# Patient Record
Sex: Male | Born: 1980 | Hispanic: Yes | Marital: Single | Smoking: Former smoker
Health system: Southern US, Community
[De-identification: ages and names within clinical notes are randomized; demographics above are authoritative.]

## PROBLEM LIST (undated history)

## (undated) DIAGNOSIS — J45909 Unspecified asthma, uncomplicated: Secondary | ICD-10-CM

## (undated) DIAGNOSIS — I10 Essential (primary) hypertension: Secondary | ICD-10-CM

## (undated) HISTORY — DX: Essential (primary) hypertension: I10

## (undated) HISTORY — PX: NO PAST SURGERIES: SHX2092

## (undated) HISTORY — DX: Unspecified asthma, uncomplicated: J45.909

---

## 2014-08-29 ENCOUNTER — Encounter (HOSPITAL_COMMUNITY): Payer: Self-pay | Admitting: Emergency Medicine

## 2014-08-29 ENCOUNTER — Emergency Department (HOSPITAL_COMMUNITY)
Admission: EM | Admit: 2014-08-29 | Discharge: 2014-08-29 | Disposition: A | Discharge status: Home / Self Care | Payer: Self-pay | Attending: Emergency Medicine | Admitting: Emergency Medicine

## 2014-08-29 ENCOUNTER — Emergency Department (HOSPITAL_COMMUNITY): Payer: Self-pay

## 2014-08-29 DIAGNOSIS — R0989 Other specified symptoms and signs involving the circulatory and respiratory systems: Principal | ICD-10-CM | POA: Insufficient documentation

## 2014-08-29 DIAGNOSIS — F411 Generalized anxiety disorder: Secondary | ICD-10-CM | POA: Insufficient documentation

## 2014-08-29 DIAGNOSIS — R0600 Dyspnea, unspecified: Secondary | ICD-10-CM

## 2014-08-29 DIAGNOSIS — R1013 Epigastric pain: Secondary | ICD-10-CM | POA: Insufficient documentation

## 2014-08-29 DIAGNOSIS — R0609 Other forms of dyspnea: Secondary | ICD-10-CM | POA: Insufficient documentation

## 2014-08-29 DIAGNOSIS — F419 Anxiety disorder, unspecified: Secondary | ICD-10-CM

## 2014-08-29 DIAGNOSIS — F17200 Nicotine dependence, unspecified, uncomplicated: Secondary | ICD-10-CM | POA: Insufficient documentation

## 2014-08-29 DIAGNOSIS — R0602 Shortness of breath: Secondary | ICD-10-CM | POA: Insufficient documentation

## 2014-08-29 DIAGNOSIS — R0789 Other chest pain: Secondary | ICD-10-CM | POA: Insufficient documentation

## 2014-08-29 LAB — COMPREHENSIVE METABOLIC PANEL
ALBUMIN: 4.5 g/dL (ref 3.5–5.2)
ALK PHOS: 71 U/L (ref 39–117)
ALT: 23 U/L (ref 0–53)
AST: 26 U/L (ref 0–37)
Anion gap: 19 — ABNORMAL HIGH (ref 5–15)
BUN: 8 mg/dL (ref 6–23)
CALCIUM: 9.9 mg/dL (ref 8.4–10.5)
CO2: 20 mEq/L (ref 19–32)
Chloride: 102 mEq/L (ref 96–112)
Creatinine, Ser: 0.58 mg/dL (ref 0.50–1.35)
GFR calc Af Amer: >90 mL/min (ref >90)
GFR calc non Af Amer: >90 mL/min (ref >90)
GLUCOSE: 106 mg/dL — AB (ref 70–99)
POTASSIUM: 4.1 meq/L (ref 3.7–5.3)
SODIUM: 141 meq/L (ref 137–147)
TOTAL PROTEIN: 8.3 g/dL (ref 6.0–8.3)
Total Bilirubin: 0.5 mg/dL (ref 0.3–1.2)

## 2014-08-29 LAB — CBC
HCT: 44.3 % (ref 39.0–52.0)
HEMOGLOBIN: 15.6 g/dL (ref 13.0–17.0)
MCH: 31.7 pg (ref 26.0–34.0)
MCHC: 35.2 g/dL (ref 30.0–36.0)
MCV: 90 fL (ref 78.0–100.0)
PLATELETS: 207 10*3/uL (ref 150–400)
RBC: 4.92 MIL/uL (ref 4.22–5.81)
RDW: 12 % (ref 11.5–15.5)
WBC: 9.9 10*3/uL (ref 4.0–10.5)

## 2014-08-29 LAB — LIPASE, BLOOD: Lipase: 22 U/L (ref 11–59)

## 2014-08-29 LAB — TROPONIN I

## 2014-08-29 NOTE — Discharge Instructions (Signed)
Follow up with primary care doctor in coming week. Return to ER if worse, new symptoms, fevers, trouble breathing, persistent/recurrent chest pain, other concern.

## 2014-08-29 NOTE — ED Notes (Signed)
Dr. Denton Lank has just spoken at length with pt. With our translator phone with [employer].  Pt. Remains in no distress.

## 2014-08-29 NOTE — ED Notes (Addendum)
Per EMS pt c/o shortness of breath x 1 week, per phone interpreter pt sts he "looses his breath for 3 seconds at a time" he stated that happened few weeks ago, however now he c/o pain "between lungs and abdomen"

## 2014-08-29 NOTE — ED Notes (Signed)
He tells me that he has occasionally had "my breathing 'stops' once-in-a-while for about a month now".  When asked if he is asthmatic, he mentions being told by a doctor in the distant past that he may have asthma.  Now his lung sounds are clear bilat., and he is breathing normally.

## 2014-08-29 NOTE — ED Provider Notes (Signed)
CSN: [id number]     Arrival date & time 08/29/14  0947 History   First MD Initiated Contact with Patient 08/29/14 1013     Chief Complaint  Patient presents with  . Abdominal Pain  . Shortness of Breath     (Consider location/radiation/quality/duration/timing/severity/associated sxs/prior Treatment) Patient is a 33 y.o. male presenting with abdominal pain and shortness of breath. The history is provided by the patient. A language interpreter was used.  Abdominal Pain Associated symptoms: shortness of breath   Associated symptoms: no chills, no cough, no diarrhea, no fever, no sore throat and no vomiting   Shortness of Breath Associated symptoms: abdominal pain   Associated symptoms: no cough, no fever, no headaches, no neck pain, no rash, no sore throat and no vomiting   pt c/o pain lower sternal and epigastric area in the past day. Pain constant, dull. Non radiating. States makes him feel short of breath for 2-3 seconds at a time. Occurs at rest. No relation to activity or exertion. Denies cough or uri c/o. No hx cad or fam hx cad. No hx gerd, pud, gallstones or pancreatitis. No specific exacerbating or alleviating factors. No nvd.  No abd distension or prior abd surgery.       History reviewed. No pertinent past medical history. History reviewed. No pertinent past surgical history. No family history on file. History  Substance Use Topics  . Smoking status: Current Some Day Smoker  . Smokeless tobacco: Not on file  . Alcohol Use: Yes     Comment: daily    Review of Systems  Constitutional: Negative for fever and chills.  HENT: Negative for sore throat.   Eyes: Negative for redness.  Respiratory: Positive for shortness of breath. Negative for cough.   Cardiovascular: Negative for leg swelling.  Gastrointestinal: Positive for abdominal pain. Negative for vomiting and diarrhea.  Genitourinary: Negative for flank pain.  Musculoskeletal: Negative for back pain and neck pain.   Skin: Negative for rash.  Neurological: Negative for headaches.  Hematological: Does not bruise/bleed easily.  Psychiatric/Behavioral: Negative for confusion.      Allergies  Pork-derived products  Home Medications   Prior to Admission medications   Medication Sig Start Date End Date Taking? Authorizing Provider  acetaminophen (TYLENOL) 500 MG tablet Take 1,000 mg by mouth every 6 (six) hours as needed for mild pain or moderate pain.   Yes Historical Provider, MD   BP 165/98  Pulse 80  Temp(Src) 98.5 F (36.9 C)  SpO2 98% Physical Exam  Nursing note and vitals reviewed. Constitutional: He appears well-developed and well-nourished. No distress.  HENT:  Head: Atraumatic.  Mouth/Throat: Oropharynx is clear and moist.  Eyes: Conjunctivae are normal. No scleral icterus.  Neck: Neck supple. No tracheal deviation present.  Cardiovascular: Normal rate, regular rhythm, normal heart sounds and intact distal pulses.  Exam reveals no gallop and no friction rub.   No murmur heard. Pulmonary/Chest: Effort normal and breath sounds normal. No accessory muscle usage. No respiratory distress. He exhibits no tenderness.  Abdominal: Soft. Bowel sounds are normal. He exhibits no distension and no mass. There is no tenderness. There is no rebound and no guarding.  Genitourinary:  No cva tenderness  Musculoskeletal: Normal range of motion. He exhibits no edema and no tenderness.  Neurological: He is alert.  Skin: Skin is warm and dry. He is not diaphoretic.  Psychiatric: He has a normal mood and affect.    ED Course  Procedures (including critical care time) Labs Review  Results for orders placed during the hospital encounter of 08/29/14  TROPONIN I      Result Value Ref Range   Troponin I <0.30  <0.30 ng/mL  CBC      Result Value Ref Range   WBC 9.9  4.0 - 10.5 K/uL   RBC 4.92  4.22 - 5.81 MIL/uL   Hemoglobin 15.6  13.0 - 17.0 g/dL   HCT 16.1  09.6 - 04.5 %   MCV 90.0  78.0 -  100.0 fL   MCH 31.7  26.0 - 34.0 pg   MCHC 35.2  30.0 - 36.0 g/dL   RDW 40.9  81.1 - 91.4 %   Platelets 207  150 - 400 K/uL  COMPREHENSIVE METABOLIC PANEL      Result Value Ref Range   Sodium 141  137 - 147 mEq/L   Potassium 4.1  3.7 - 5.3 mEq/L   Chloride 102  96 - 112 mEq/L   CO2 20  19 - 32 mEq/L   Glucose, Bld 106 (*) 70 - 99 mg/dL   BUN 8  6 - 23 mg/dL   Creatinine, Ser 7.82  0.50 - 1.35 mg/dL   Calcium 9.9  8.4 - 95.6 mg/dL   Total Protein 8.3  6.0 - 8.3 g/dL   Albumin 4.5  3.5 - 5.2 g/dL   AST 26  0 - 37 U/L   ALT 23  0 - 53 U/L   Alkaline Phosphatase 71  39 - 117 U/L   Total Bilirubin 0.5  0.3 - 1.2 mg/dL   GFR calc non Af Amer >90  >90 mL/min   GFR calc Af Amer >90  >90 mL/min   Anion gap 19 (*) 5 - 15  LIPASE, BLOOD      Result Value Ref Range   Lipase 22  11 - 59 U/L   Dg Chest 2 View  08/29/2014   CLINICAL DATA:  Shortness of breath.  EXAM: CHEST  2 VIEW  COMPARISON:  None.  FINDINGS: The cardiomediastinal silhouette is unremarkable.  Mild peribronchial thickening/interstitial prominence is noted.  There is no evidence of focal airspace disease, pulmonary edema, suspicious pulmonary nodule/mass, pleural effusion, or pneumothorax. No acute bony abnormalities are identified.  IMPRESSION: Mild peribronchial thickening/nonspecific interstitial prominence of uncertain chronicity. No other significant abnormalities identified.   Electronically Signed   By: Laveda Abbe M.D.   On: 08/29/2014 11:00       EKG Interpretation   Date/Time:  Sunday August 29 2014 11:56:13 EDT Ventricular Rate:  89 PR Interval:  149 QRS Duration: 82 QT Interval:  330 QTC Calculation: 401 R Axis:   62 Text Interpretation:  Sinus arrhythmia Nonspecific ST abnormality No  previous tracing Confirmed by Denton Lank  MD, Caryn Bee ([id number]) on 08/29/2014  12:57:17 PM      MDM  Labs. Xr.  Reviewed nursing notes and prior charts for additional history.   Recheck pt, remains awake and alert, nsr on  monitor.  No dyspnea or increased wob.   Pt appears medically stable for discharge.  Discussed close outpatient pcp follow up.    Suzi Roots, MD 08/29/14 [phone]

## 2015-05-05 IMAGING — CR DG CHEST 2V
2 series · 2 of 2 positions shown · non-contrast
Comparison: None.

CLINICAL DATA: Shortness of breath.

EXAM:
CHEST  2 VIEW

[w chest pa]
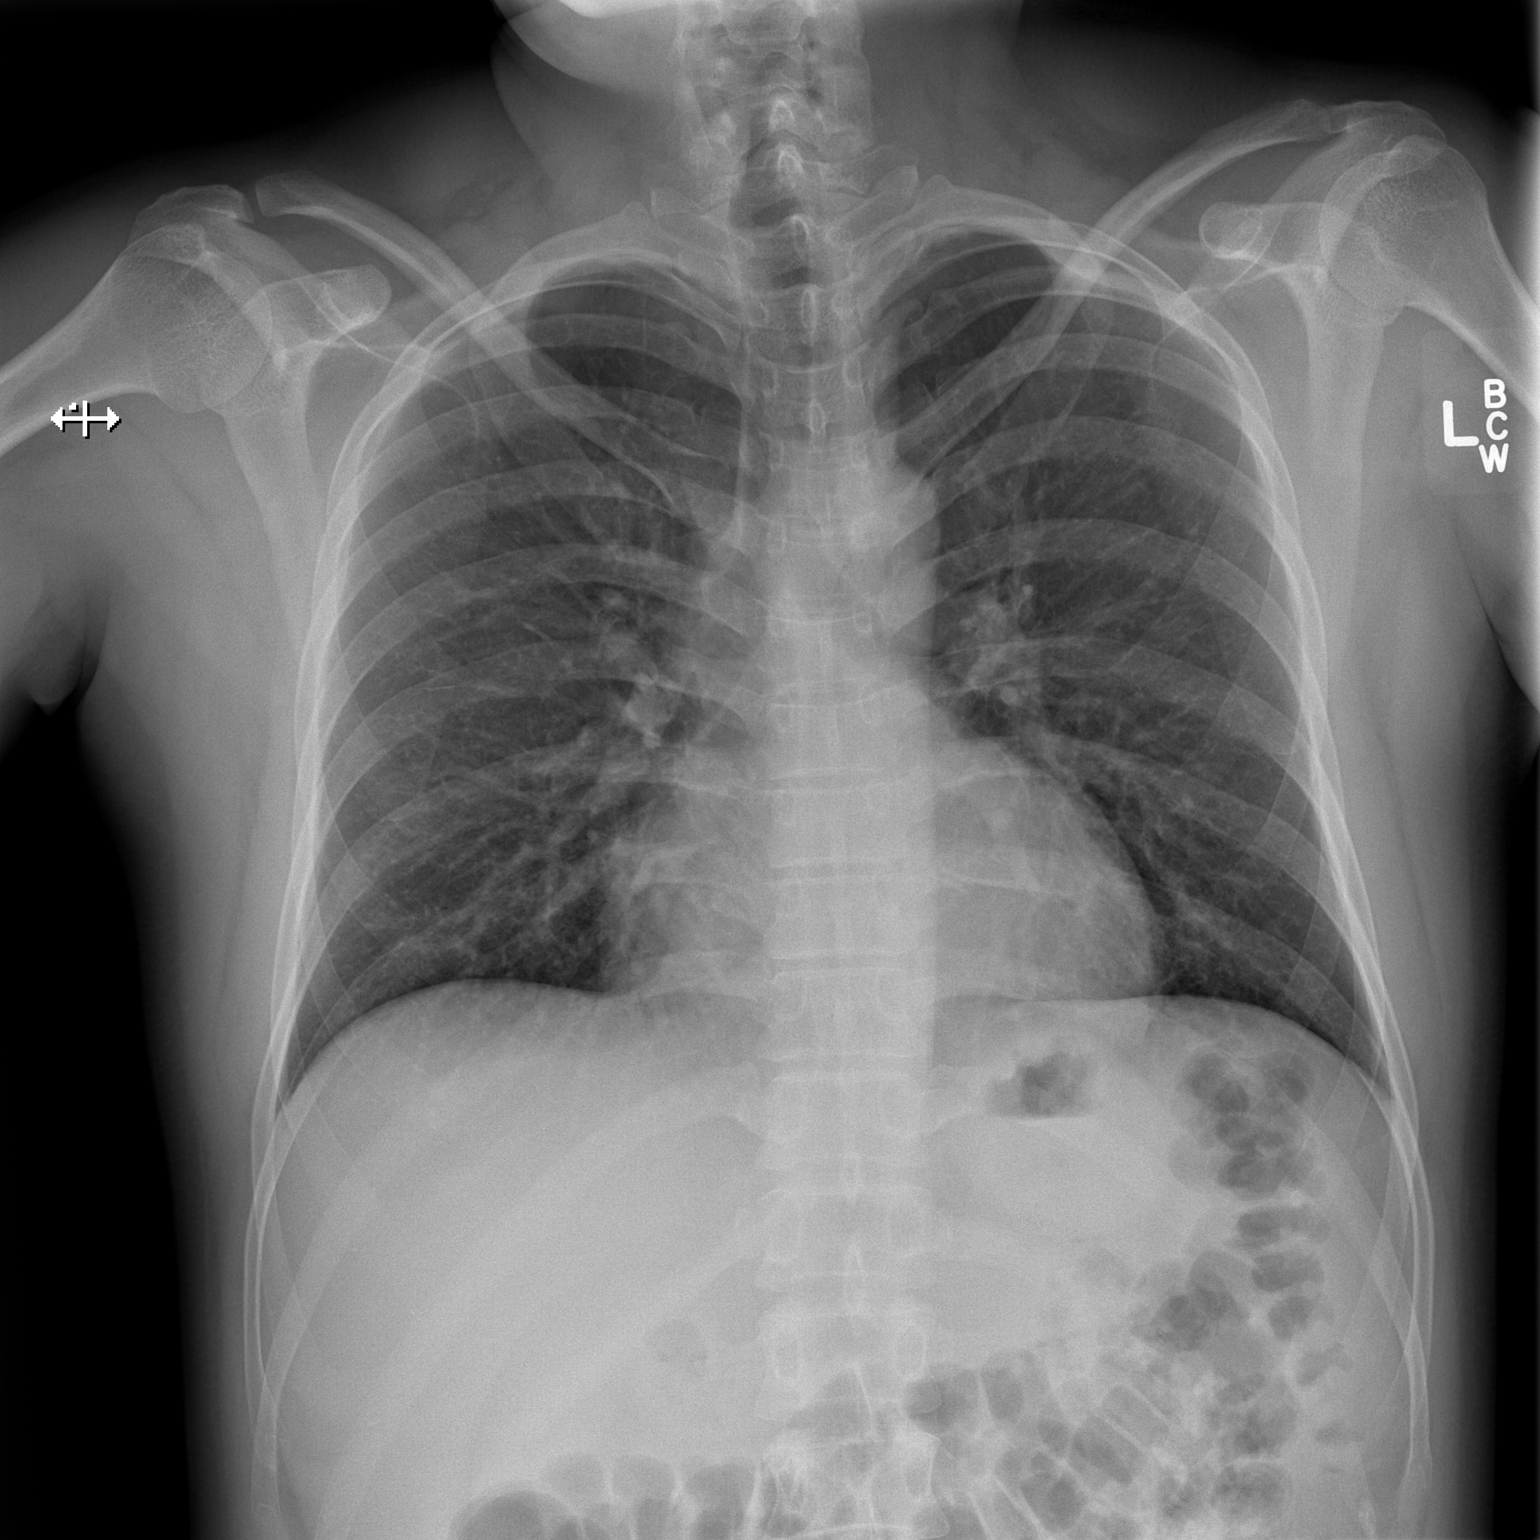

[w chest lat]
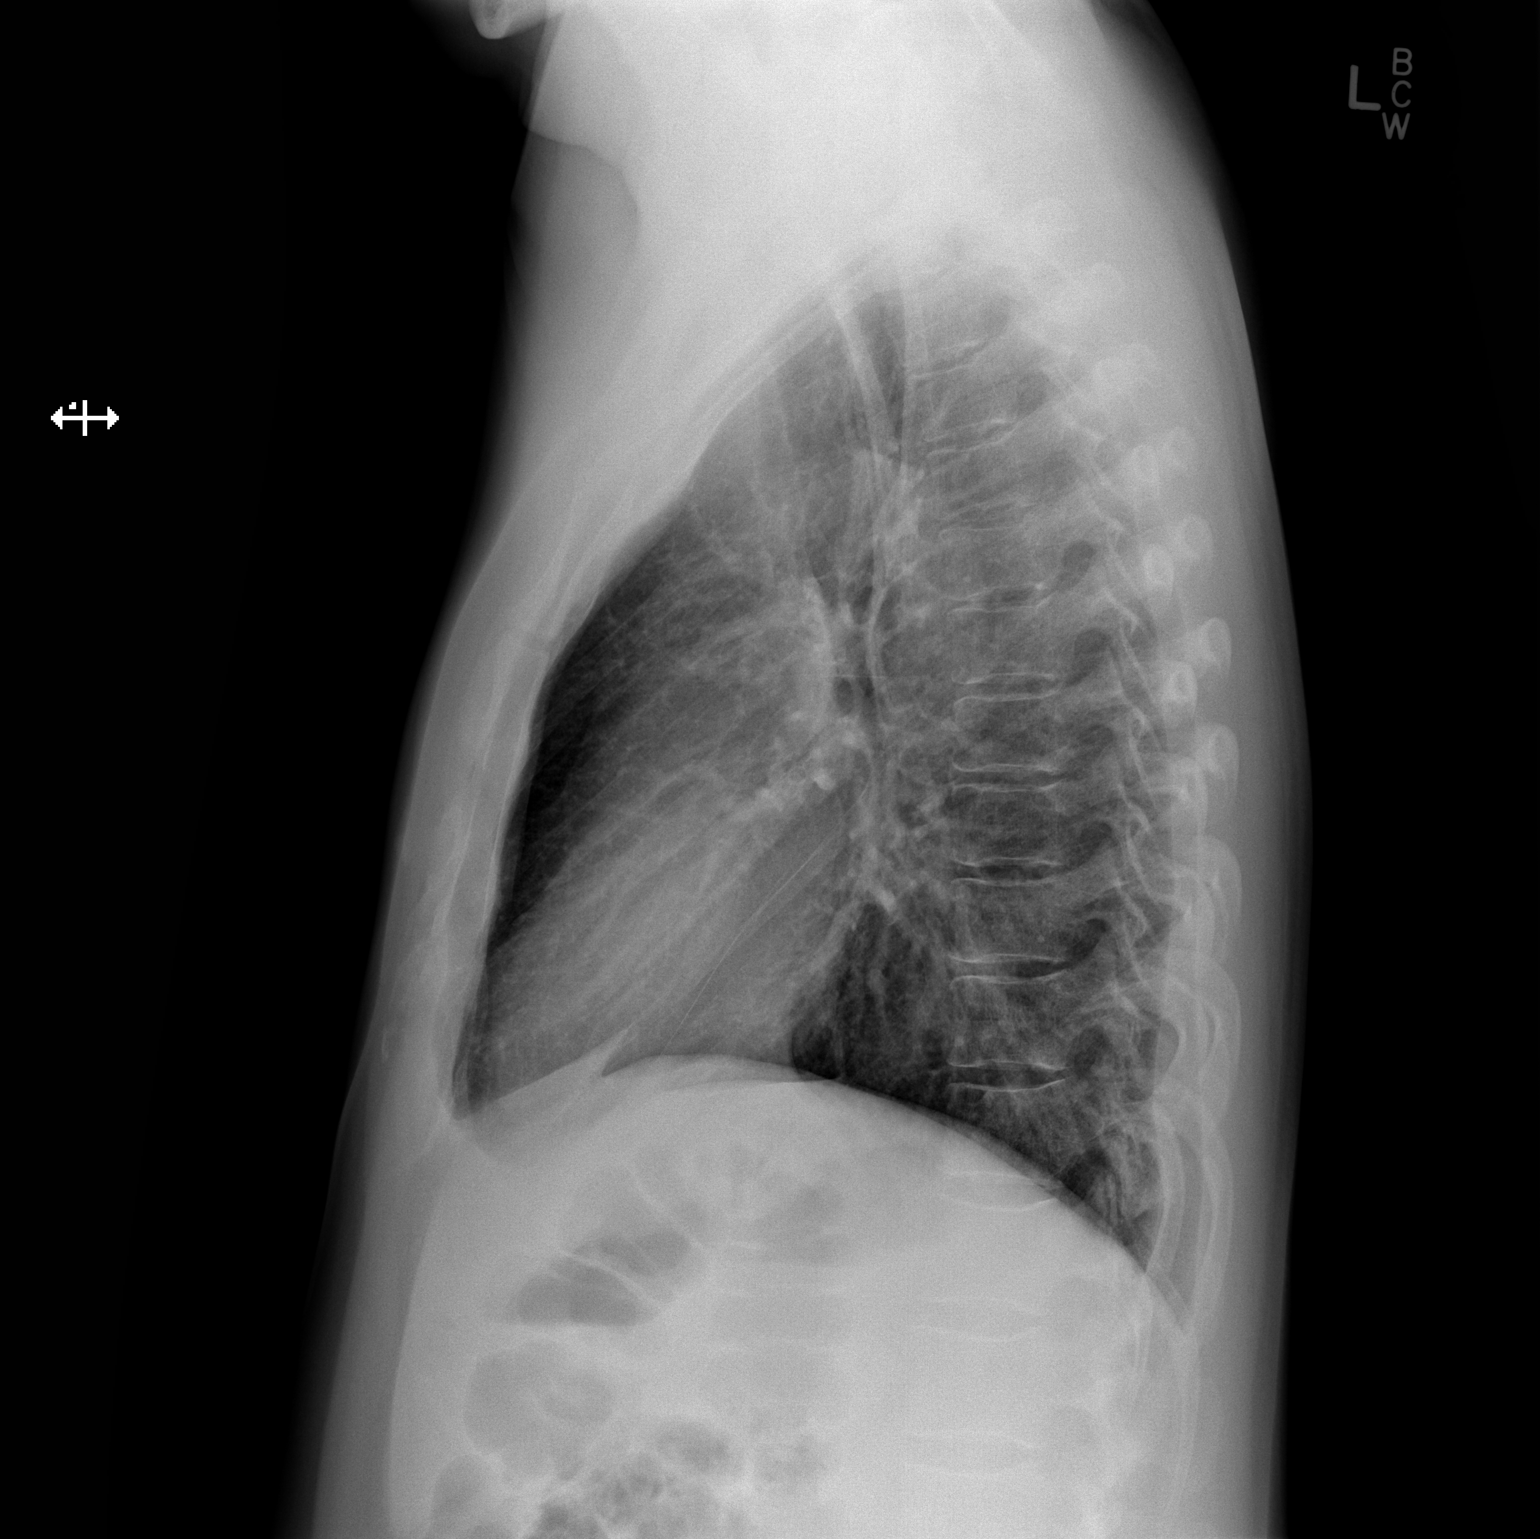

[2 of 2 positions shown; findings below may reference images not displayed]

FINDINGS: The cardiomediastinal silhouette is unremarkable.

Mild peribronchial thickening/interstitial prominence is noted.

There is no evidence of focal airspace disease, pulmonary edema,
suspicious pulmonary nodule/mass, pleural effusion, or pneumothorax.
No acute bony abnormalities are identified.
IMPRESSION: Mild peribronchial thickening/nonspecific interstitial prominence of
uncertain chronicity. No other significant abnormalities identified.

## 2018-09-05 ENCOUNTER — Ambulatory Visit: Payer: Self-pay | Attending: Family Medicine | Admitting: Family Medicine

## 2018-09-05 ENCOUNTER — Other Ambulatory Visit: Payer: Self-pay

## 2018-09-05 ENCOUNTER — Encounter: Payer: Self-pay | Admitting: Family Medicine

## 2018-09-05 VITALS — BP 129/85 | HR 61 | Temp 98.6°F | Resp 24 | Ht 66.25 in | Wt 164.2 lb

## 2018-09-05 DIAGNOSIS — N529 Male erectile dysfunction, unspecified: Secondary | ICD-10-CM

## 2018-09-05 DIAGNOSIS — I10 Essential (primary) hypertension: Secondary | ICD-10-CM | POA: Insufficient documentation

## 2018-09-05 DIAGNOSIS — Z833 Family history of diabetes mellitus: Secondary | ICD-10-CM | POA: Insufficient documentation

## 2018-09-05 LAB — POCT GLYCOSYLATED HEMOGLOBIN (HGB A1C): Hemoglobin A1C: 5.6 % (ref 4.0–5.6)

## 2018-09-05 LAB — GLUCOSE, POCT (MANUAL RESULT ENTRY): POC Glucose: 114 mg/dL — AB (ref 70–99)

## 2018-09-05 MED ORDER — SILDENAFIL CITRATE 100 MG PO TABS: 50.0000 mg | ORAL_TABLET | Freq: Every day | ORAL | 6 refills | Status: DC | PRN

## 2018-09-05 NOTE — Progress Notes (Signed)
Subjective:    Patient ID: Angel Robles, male    DOB: January 06, 1981, 37 y.o.   MRN: [id number]  HPI 37 year old male who is new to the practice.  Patient presents secondary to complaint of erectile dysfunction and he would like to be prescribed medication to help with this condition.  Patient states that he has difficulty achieving and maintaining an erection.  Patient reports that this problem has been present since he was about 37 years old.  Patient states that he does recall being dropped on his back by a cousin when he was around 57 years of age but he is not sure that this is what caused him to have issues with erectile dysfunction but he states that this could have caused his issues as he believes that problems with erectile dysfunction started around that time.  Patient denies any current issues with back pain with radiation.  Patient does not have any bowel or bladder dysfunction.  He will follow-up in the past with a blood pressure medication but then was able to stop the medication blood pressure returned to normal.  Patient denies any other medical history.  Patient has no known drug allergies.  Patient has had no prior surgeries.  Patient denies any use of tobacco currently but he did smoke in the past.  Patient states that he occasionally, about a week drinks 4 beers.  Patient has family history of his mother having had diabetes and his hypertension.  Patient denies any cancers, heart disease or strokes in his family.  Patient reports that he was back in [place] to help with erectile dysfunction.  Patient states that he is not sure what the medicine was on the dose but he did feel that this helped.  Patient denies any dysuria, no urinary frequency.  Patient does not have to strain to initiate his urinary stream.  Patient denies any penile discharge and does not have any concerns regarding sexually transmitted infections.  Patient states that he recently got married and this is probably the reason that  he is now seeking treatment for erectile dysfunction.    Review of Systems  Constitutional: Negative for chills, fatigue and fever.  Respiratory: Negative for cough and shortness of breath.   Cardiovascular: Negative for chest pain, palpitations and leg swelling.  Gastrointestinal: Negative for abdominal pain, constipation and nausea.  Genitourinary: Negative for decreased urine volume, difficulty urinating, discharge, dysuria, flank pain, frequency, hematuria, testicular pain and urgency.  Musculoskeletal: Negative for back pain and joint swelling.  Neurological: Negative for dizziness, numbness and headaches.       Objective:   Physical Exam BP 129/85 (BP Location: Left Arm, Patient Position: Sitting, Cuff Size: Normal)   Pulse 61   Temp 98.6 F (37 C) (Oral)   Resp (!) 24   Ht 5' 6.25" (1.683 m)   Wt 164 lb 3.2 oz (74.5 kg)   SpO2 98%   BMI 26.30 kg/m  Vital signs and nurse's note reviewed General- well-nourished, well-developed slightly overweight male in no acute distress Neck-supple, no lymphadenopathy, no thyromegaly, no carotid bruit Cardiovascular-regular rate and rhythm Abdomen-soft, nontender Back-no CVA tenderness, no reproducible back pain, mild thoracolumbar paraspinous spasm Extremities-no edema       Assessment & Plan:  1. Erectile dysfunction, unspecified erectile dysfunction type Patient will have urinalysis, glucose and hemoglobin A1c done in follow-up of his issues with erectile dysfunction.  Patient is provided with prescription for Viagra 100 mg to take half or 1 whole pill as needed  for erectile dysfunction.  Patient was given information on erectile dysfunction in Spanish as part of his after visit summary.  Patient is to return to clinic in approximately 2 months to let me know if the medication has been helpful.  If medication has not helped, patient will likely need referral to urology for further evaluation and treatment. - HgB A1c - Glucose  (CBG) - POCT URINALYSIS DIP (CLINITEK) - sildenafil (VIAGRA) 100 MG tablet; Take 0.5-1 tablets (50-100 mg total) by  mouth daily as needed for erectile dysfunction.  Dispense: 10 tablet; Refill: 6  *Patient was offered influenza immunization which he declined  An After Visit Summary was printed and given to the patient.  Return in about 8 weeks (around 10/31/2018).

## 2018-09-05 NOTE — Patient Instructions (Signed)
Disfuncin erctil (Erectile Dysfunction) La disfuncin erctil es la incapacidad de lograr una ereccin suficiente como para Winferd Humphreytener una relacin sexual. La disfuncin erctil implica:  Imposibilidad de Presenter, broadcastinglograr una ereccin.  Falta de rigidez suficiente que permita la penetracin.  Falta de ereccin antes de finalizar la relacin sexual.  Lawyeryaculacin precoz. CAUSAS  Ciertos medicamentos como: ? Analgsicos. ? Antihistamnicos. ? Antidepresivos. ? Medicamentos para la presin arterial. ? Uso de diurticos. ? Medicamentos para la lcera. ? Relajantes musculares. ? Drogas ilegales.  Consumo excesivo de alcohol.  Causas psicolgicas como: ? Ansiedad. ? Depresin. ? Tristeza. ? Agotamiento. ? Temor al desempeo. ? Estrs.  Causas fsicas como: ? Problemas arteriales. Aqu se incluyen la diabetes, el hbito de fumar, enfermedades hepticas o aterosclerosis. ? Hipertensin arterial. ? Problemas hormonales como en los casos de bajo nivel de [place] Pawtestosterona. ? Obesidad. ? Problemas neurolgicos. Se incluyen lesiones en la espalda o la pelvis, diabetes mellitus, esclerosis mltiple o enfermedad de Parkinson.  SNTOMAS  Imposibilidad de Presenter, broadcastinglograr una ereccin.  Falta de rigidez suficiente que permita la penetracin.  Falta de ereccin antes de finalizar la relacin sexual.  Lawyeryaculacin precoz.  A veces puede haber erecciones normales, pero con frecuencia son episodios insatisfactorios.  Puede ocurrir que los orgasmos no sean satisfactorios en sensacin o frecuencia.  [place] satisfaccin sexual en ambos miembros de la pareja debido a los problemas erctiles.  Con la ereccin el pene puede curvarse. Esto puede provocar dolor o la curvatura ser demasiado pronunciada como para permitir la relacin sexual.  No tener nunca erecciones nocturnas.  DIAGNSTICO El mdico podr hacer un diagnstico basndose en:  Un examen fsico para descartar otras enfermedades o problemas  especficos en el pene.  Preguntas detalladas acerca del problema.  Anlisis de sangre para descartar diabetes o para medir los niveles de hormonas.  Anlisis de orina para hallar enfermedades subyacentes.  Sherlyn LeesUna ecografa para descartar cicatrices.  Prueba de flujo sanguneo en el pene.  Estudio del sueo para medir las erecciones nocturnas. TRATAMIENTO  Medicamentos por va oral.  Medicamentos inyectables en el pene.  En algunos casos se aconseja una bomba de vaco con anillo  Ciruga de implante de pene. Pueden implantarle: ? Implante inflable. ? Implante semirgido.  Ciruga de vasos sanguneos.  INSTRUCCIONES PARA EL CUIDADO EN EL HOGAR  Si le indicaron medicamentos por va oral, debe tomarlos segn las indicaciones. No aumente la dosis sin comentarlo primero con su mdico.  Si [place] inyecciones que se aplica usted mismo, evite las venas que estn en la superficie del pene. Aplique presin en el sitio de la inyeccin durante 5 minutes.  Si [place] una bomba de vaco, asegrese de que ha [place] las instrucciones antes de usarlas. Haga preguntas a su mdico antes de llevar la bomba a su casa.  SOLICITE ATENCIN MDICA SI:  Siente dolor que no responde a los Public affairs consultantanalgsicos que le recetaron.  Tiene nuseas o vmitos.  SOLICITE ATENCIN MDICA DE INMEDIATO SI:  Al administrarse los medicamentos orales o inyectables experimenta una ereccin que dura ms de 4 horas. Si el mdico no est disponible, concurra al servicio de emergencias ms cercano para Development worker, communityrealizar una evaluacin. Una ereccin que dure ms de 4 horas, puede dar como resultado un dao permanente en el pene.  Tiene un dolor intenso.  Siente dolor, u observa hinchazn o enrojecimiento en el pene.  Tiene una zona colorada que se extiende hacia la ingle o hacia la zona baja del abdomen.  No puede orinar.  Esta informacin no tiene  como fin reemplazar el consejo del mdico. Asegrese de hacerle al mdico cualquier pregunta  que tenga. Document Released: 12/03/2005 Document Revised: 08/05/2013 Document Reviewed: 05/07/2013 Elsevier Interactive Patient Education  2017 Elsevier Inc.  

## 2018-09-05 NOTE — Progress Notes (Signed)
Concerns with erectile dysfunction Bumps on hands 1-2 x yearly (fall and summer)

## 2018-11-03 ENCOUNTER — Ambulatory Visit: Payer: Self-pay | Admitting: Family Medicine

## 2020-05-13 ENCOUNTER — Ambulatory Visit: Payer: Self-pay | Admitting: Family Medicine

## 2020-05-30 NOTE — Progress Notes (Deleted)
   Subjective:    Patient ID: Angel Robles, male    DOB: 1980-12-20, 39 y.o.   MRN: [id number]  Here to re est care. Last seen 9 /2019       Review of Systems     Objective:   Physical Exam        Assessment & Plan:

## 2020-05-31 ENCOUNTER — Ambulatory Visit: Payer: Self-pay | Admitting: Critical Care Medicine

## 2023-11-08 ENCOUNTER — Other Ambulatory Visit: Payer: Self-pay

## 2023-11-08 ENCOUNTER — Ambulatory Visit: Payer: Self-pay | Attending: Internal Medicine | Admitting: Internal Medicine

## 2023-11-08 ENCOUNTER — Encounter: Payer: Self-pay | Admitting: Internal Medicine

## 2023-11-08 VITALS — BP 149/93 | HR 88 | Temp 98.8°F | Ht 66.0 in | Wt 153.0 lb

## 2023-11-08 DIAGNOSIS — Z2821 Immunization not carried out because of patient refusal: Secondary | ICD-10-CM

## 2023-11-08 DIAGNOSIS — Z7689 Persons encountering health services in other specified circumstances: Secondary | ICD-10-CM

## 2023-11-08 DIAGNOSIS — I10 Essential (primary) hypertension: Secondary | ICD-10-CM

## 2023-11-08 DIAGNOSIS — N529 Male erectile dysfunction, unspecified: Secondary | ICD-10-CM

## 2023-11-08 MED ORDER — AMLODIPINE BESYLATE 5 MG PO TABS
5.0000 mg | ORAL_TABLET | Freq: Every day | ORAL | 1 refills | Status: DC
  Filled 2023-11-08 – 2023-11-29 (×2): qty 90, 90d supply, fill #0

## 2023-11-08 MED ORDER — SILDENAFIL CITRATE 50 MG PO TABS
50.0000 mg | ORAL_TABLET | ORAL | 3 refills | Status: AC
  Filled 2023-11-08: qty 10, 30d supply, fill #0
  Filled 2023-11-29: qty 10, 10d supply, fill #0

## 2023-11-08 NOTE — Progress Notes (Signed)
Patient ID: Angel Robles, male    DOB: [birth year]  MRN: [id number]  CC: Establish Care (Est care / new pt./Erectile dysfuction concerns /No to flu & Tdap vax. )   Subjective: Angel Robles is a 42 y.o. male who presents for new pt visit. His concerns today include:  Patient with history of ED.  [employer] Language interpreter used during this encounter. #Angel Robles [id number]  To re-est care. Denies any chronic med issues BP noted to be elevated.  Thinks he was told in past that BP was elev.  Never on med for it.  Admits he uses a lot of salt in foods sometimes. Hx of ED and was prescribed Sildenafil, he does not recall if he filled it or not.  Purchased an OTC med from a gas station that helped a little.  Endorses getting and maintaining erection Past hx of tob use.  Reports he has not smoked except a few times this yr.    No current outpatient medications on file prior to visit.   No current facility-administered medications on file prior to visit.    Allergies  Allergen Reactions   Pork-Derived Products Rash    Social History   Socioeconomic History   Marital status: Single    Spouse name: Not on file   Number of children: Not on file   Years of education: Not on file   Highest education level: Not on file  Occupational History   Not on file  Tobacco Use   Smoking status: Former   Smokeless tobacco: Never  Vaping Use   Vaping status: Never Used  Substance and Sexual Activity   Alcohol use: Yes    Alcohol/week: 2.0 standard drinks of alcohol    Types: 2 Cans of beer per week    Comment: occ.   Drug use: No   Sexual activity: Yes    Partners: Female  Other Topics Concern   Not on file  Social History Narrative   Not on file   Social Determinants of Health   Financial Resource Strain: Not on file  Food Insecurity: Not on file  Transportation Needs: Not on file  Physical Activity: Sufficiently Active (09/05/2018)   Exercise Vital Sign    Days of Exercise per Week: 5  days    Minutes of Exercise per Session: 90 min  Stress: Stress Concern Present (09/05/2018)   [employer] of Occupational Health - Occupational Stress Questionnaire    Feeling of Stress : To some extent  Social Connections: Not on file  Intimate Partner Violence: Not on file    Family History  Problem Relation Age of Onset   Diabetes Mother     Past Surgical History:  Procedure Laterality Date   NO PAST SURGERIES      ROS: Review of Systems Negative except as stated above  PHYSICAL EXAM: BP (!) 149/93   Pulse 88   Temp 98.8 F (37.1 C) (Oral)   Ht 5\' 6"  (1.676 m)   Wt 153 lb (69.4 kg)   SpO2 98%   BMI 24.69 kg/m   Physical Exam   General appearance - alert, well appearing, and in no distress Neck - supple, no significant adenopathy Chest - clear to auscultation, no wheezes, rales or rhonchi, symmetric air entry Heart - normal rate, regular rhythm, normal S1, S2, no murmurs, rubs, clicks or gallops Extremities - peripheral pulses normal, no pedal edema, no clubbing or cyanosis     Latest Ref Rng & Units 08/29/2014  11:17 AM  CMP  Glucose 70 - 99 mg/dL 409   BUN 6 - 23 mg/dL 8   Creatinine 8.11 - 9.14 mg/dL 7.82   Sodium 956 - 213 mEq/L 141   Potassium 3.7 - 5.3 mEq/L 4.1   Chloride 96 - 112 mEq/L 102   CO2 19 - 32 mEq/L 20   Calcium 8.4 - 10.5 mg/dL 9.9   Total Protein 6.0 - 8.3 g/dL 8.3   Total Bilirubin 0.3 - 1.2 mg/dL 0.5   Alkaline Phos 39 - 117 U/L 71   AST 0 - 37 U/L 26   ALT 0 - 53 U/L 23    Lipid Panel  No results found for: "CHOL", "TRIG", "HDL", "CHOLHDL", "VLDL", "LDLCALC", "LDLDIRECT"  CBC    Component Value Date/Time   WBC 9.9 08/29/2014 1117   RBC 4.92 08/29/2014 1117   HGB 15.6 08/29/2014 1117   HCT 44.3 08/29/2014 1117   PLT 207 08/29/2014 1117   MCV 90.0 08/29/2014 1117   MCH 31.7 08/29/2014 1117   MCHC 35.2 08/29/2014 1117   RDW 12.0 08/29/2014 1117    ASSESSMENT AND PLAN: 1. Establishing care with new doctor,  encounter for   2. Erectile dysfunction, unspecified erectile dysfunction type Discussed putting him on Viagra.  Pt advised of possible side effects of this medication including prolong erection, flushing, headaches, stuff nose and sudden vision and hearing changes.  Pt told to be seen in ER if he has erection lasting longer than 3-4 hrs, or if he has sudden vision changes or hearing loss. He is agreeable to trying the med - sildenafil (VIAGRA) 50 MG tablet; 1 tab PO 30-60 minutes prior to intercourse.  Limit use to 1 tab/24 hrs  Dispense: 10 tablet; Refill: 3  3. Essential hypertension DASH diet discussed and encouraged.  Advise start Norvasc 5 mg. - CBC - Comprehensive metabolic panel - Lipid panel - amLODipine (NORVASC) 5 MG tablet; Take 1 tablet (5 mg total) by mouth daily.  Dispense: 90 tablet; Refill: 1  4. Influenza vaccination declined   5. Tetanus, diphtheria, and acellular pertussis (Tdap) vaccination declined     Patient was given the opportunity to ask questions.  Patient verbalized understanding of the plan and was able to repeat key elements of the plan.   This documentation was completed using Paediatric nurse.  Any transcriptional errors are unintentional.  Orders Placed This Encounter  Procedures   CBC   Comprehensive metabolic panel   Lipid panel     Requested Prescriptions   Signed Prescriptions Disp Refills   sildenafil (VIAGRA) 50 MG tablet 10 tablet 3    Sig: Take 1 tablet by mouth 30-60 minutes prior to intercourse.  Limit use to 1 tab/24 hrs.   amLODipine (NORVASC) 5 MG tablet 90 tablet 1    Sig: Take 1 tablet (5 mg total) by mouth daily.    Return in about 6 weeks (around 12/20/2023) for for BP check.  Jonah Blue, MD, FACP

## 2023-11-08 NOTE — Patient Instructions (Signed)
Plan de alimentacin DASH DASH Eating Plan DASH es la sigla en ingls de "Enfoques alimentarios para detener la hipertensin" (Dietary Approaches to Stop Hypertension). El plan de alimentacin DASH ha demostrado: Bajar la presin arterial alta (hipertensin). Reducir el riesgo de diabetes tipo 2, enfermedad cardaca y accidente cerebrovascular. Ayudar a perder peso. Consejos para seguir Consulting civil engineer las etiquetas de los alimentos Verifique la cantidad de sal (sodio) por porcin en las etiquetas de los alimentos. Elija alimentos con menos del 5 por ciento del Licensed conveyancer diario (VD) de sodio. En general, los alimentos con menos de 300 miligramos (mg) de sodio por porcin se encuadran dentro de este plan alimentario. Para encontrar cereales integrales, busque la palabra "integral" como primera palabra en la lista de ingredientes. Al ir de compras Compre productos en los que en su etiqueta diga: "bajo contenido de sodio" o "sin sal agregada". Compre alimentos frescos. Evite los alimentos enlatados y comidas precocidas o congeladas. Al cocinar Trate de no agregar sal cuando cocine. Use hierbas o aderezos sin sal, en lugar de sal de mesa o sal marina. Consulte al mdico o farmacutico antes de usar sustitutos de la sal. No fra los alimentos. A la hora de cocinar los alimentos, opte por hornearlos, hervirlos, grillarlos, asarlos al horno o a la parrilla. Cocine con aceites que sean buenos para el corazn. Estos incluyen el aceite de [place], canola, [place], soja y [place]. Planificacin de las comidas  Siga una dieta equilibrada. Esto debe incluir lo siguiente: 4 o ms porciones de frutas y 4 o ms porciones de Warehouse manager. Trate de que medio plato de cada comida sea de frutas y verduras. De 6 a 8 porciones de [employer]. 6 o menos porciones de carne [place], ave o pescado por [employer]. 1 onza es 1 porcin. Una porcin de 3 onzas (85 g) de carne tiene casi el mismo tamao que la  palma de la [place]. Un huevo es 1 onza (28 g). De 2 a 3 porciones de productos lcteos descremados por da. Una porcin es 1 taza (237 ml). 1 porcin de frutos secos, semillas o frijoles 5 veces por semana. De 2 a 3 porciones de grasas cardiosaludables. Las grasas saludables llamadas cidos grasos omega-3 se encuentran en alimentos como las nueces, las semillas de [place], las leches fortificadas y [place]. Estas grasas tambin se encuentran en los pescados de agua fra, como la sardina, el salmn y la caballa. Limite la cantidad que consume de: Alimentos enlatados o envasados. Alimentos con alto contenido de grasa trans, como alimentos fritos. Alimentos con alto contenido de grasa saturada, como carne con grasa. Postres y otros dulces, bebidas azucaradas y otros alimentos con azcar agregada. Productos lcteos enteros. No le agregue sal a los alimentos antes de probarlos. No coma ms de 4 yemas de huevo por semana. Trate de comer al menos 2 comidas vegetarianas por semana. Consuma ms comida casera y menos de restaurante, de bares y comida rpida. Estilo de vida Cuando coma en un restaurante, pida que preparen su comida con menos sal, o bien, sin nada de sal. Si bebe alcohol: Limite la cantidad que bebe a lo siguiente: De 0 a 1 medida al da si es [place]. De 0 a 2 medidas al da si es varn. Sepa cunta cantidad de alcohol hay en las bebidas que toma. En los [address], una medida es una botella de cerveza de 12 oz (355 ml), un vaso de vino de 5 oz (148 ml) o  un vaso de una bebida alcohlica de alta graduacin de 1 oz (44 ml). Informacin general Evite ingerir ms de 2,300 mg de sal por da. Si tiene hipertensin, es posible que necesite reducir la ingesta de sodio a 1,500 mg por da. Trabaje con el mdico para [employer] en un peso saludable o para perder peso. Pregunte cul es el rango de peso recomendable para usted. La [employer] de la semana, realice al menos 30 minutos de  ejercicio que haga que se acelere su corazn. Estos pueden incluir caminar, nadar o andar en bicicleta. Trabaje con su mdico o nutricionista para ajustar su plan alimentario a sus necesidades calricas especficas. Qu alimentos debo comer? Frutas Todas las frutas frescas, congeladas o secas. Frutas enlatadas que estn en su jugo natural y que no tengan azcar agregada. Verduras Verduras frescas o congeladas crudas, cocidas al vapor, asadas o grilladas. Jugos de tomate y verduras con bajo contenido de sodio o reducidos en sodio. Salsa y pasta de tomate con bajo contenido de sodio o reducidas en sodio. Verduras enlatadas con bajo contenido de sodio o reducidas en sodio. Granos Pan de salvado o integral. Pasta de salvado o integral. Arroz integral. Avena. Quinua. Trigo burgol. Cereales integrales y con bajo contenido de [place]. Pan pita. Galletitas de [place] con bajo contenido de [place] (the [place] of 60 deg [place]) y [place]. Tortillas de [place] integral. Carnes y otras protenas Pollo o pavo sin piel. Carne de pollo o de [place]. Cerdo desgrasado. Pescado y [employer]. Claras de huevo. Porotos, guisantes o lentejas secos. Frutos secos, mantequilla de frutos secos y semillas sin sal. Frijoles enlatados sin sal. Cortes de carne vacuna magra, desgrasada. Carne precocida o curada magra y baja en sodio, como embutidos o panes de carne. Lcteos Leche descremada (1 %) o descremada (desnatada). Quesos reducidos en grasa, con bajo contenido de grasa o descremados. Queso blanco o ricota sin grasa, con bajo contenido de [place]. Yogur semidescremado o descremado. Queso con bajo contenido de [place] y [place]. Grasas y [employer] untables que no contengan grasas trans. Aceite vegetal. Jerolyn Shin y aderezos para ensaladas livianos, reducidos en grasa o con bajo contenido de grasas (reducidos en sodio). Aceite de canola, crtamo, oliva, aguacate, soja y [place]. Aguac[place]. Alios y condimentos Hierbas. Especias. Mezclas de condimentos sin  sal. Otros alimentos Palomitas de maz y pretzels sin sal. Dulces con bajo contenido de grasas. Es posible que los productos que se enumeran ms arriba no sean todos los alimentos y las bebidas que puede consumir. Consulte a un nutricionista para obtener ms informacin. Qu alimentos debo evitar? Nils Pyle Fruta enlatada en almbar liviano o espeso. Frutas cocidas en aceite. Frutas con salsa de crema o mantequilla. Verduras Verduras con crema o fritas. Verduras en salsa de [place]. Verduras enlatadas regulares que no sean con bajo contenido de sodio o reducidas en sodio. Pasta y salsa de tomates enlatadas regulares que no sean con bajo contenido de sodio o reducidas en sodio. Jugos de tomate y de verduras regulares que no sean con bajo contenido de sodio o reducidos en sodio. Pepinillos. Aceitunas. Granos Productos de panificacin hechos con grasa, como medialunas, magdalenas y algunos panes. Comidas con arroz o pasta seca listas para usar. Carnes y [address] de carne con alto contenido de Holiday representative. Costillas. Carne frita. [place]. [place], salame y otras carnes precocidas o curadas, como embutidos o panes de carne, que no sean magros y que no sean con bajo contenido de [place]. Grasa de la espalda del cerdo (panceta). Salchicha de cerdo. Frutos secos  y semillas con sal. Frijoles enlatados con sal agregada. Pescado enlatado o ahumado. Huevos enteros o yemas. Pollo o pavo con piel. Lcteos Leche entera o al 2 %, crema y [address] y mitad crema. Queso crema entero o con toda su grasa. Yogur entero o endulzado. Quesos con toda su grasa. Sustitutos de cremas no lcteas. Coberturas batidas. Quesos para untar y quesos procesados. Grasas y [employer]. Margarina en barra. Manteca de cerdo. Materia grasa. Mantequilla clarificada. Grasa de tocino. Aceites tropicales como aceite de coco, palmiste o palma. Alios y condimentos Sal de cebolla, sal de ajo, sal condimentada, sal de mesa y sal  marina. Salsa [place]. Salsa trtara. Salsa barbacoa. Salsa teriyaki. Salsa de soja, incluso la que tiene contenido reducido de [place]. Salsa de carne. Salsas en lata y envasadas. Salsa de pescado. Salsa de [place]. Salsa rosada. Rbanos picantes comprados en tiendas. [place]. Mostaza. Saborizantes y tiernizantes para carne. Caldo en cubitos. Salsas picantes. Adobos preelaborados o envasados. Aderezos para tacos preelaborados o envasados. Salsas. Aderezos comunes para ensalada. Otros alimentos Palomitas de maz y pretzels con sal. Es posible que los productos que se enumeran ms arriba no sean todos los alimentos y las bebidas que Personnel officer. Consulte a un nutricionista para obtener ms informacin. Dnde obtener ms informacin BJ's, Lung, and Blood Institute (Instituto Nacional del [place], los Pulmones y la [place], [place]): BuffaloDryCleaner.gl American Heart Association (Asociacin Estadounidense del [place], [place]): heart.org Academy of Nutrition and Dietetics (Academia de Nutricin y Pension scheme manager): eatright.org National Kidney Foundation (NKF) (Fundacin Nacional del Rin): kidney.org Esta informacin no tiene Theme park manager el consejo del mdico. Asegrese de hacerle al mdico cualquier pregunta que tenga. Document Revised: 01/11/2023 Document Reviewed: 01/11/2023 Elsevier Patient Education  2024 [employer].

## 2023-11-09 LAB — CBC
Hematocrit: 45.2 % (ref 37.5–51.0)
Hemoglobin: 15.9 g/dL (ref 13.0–17.7)
MCH: 32.7 pg (ref 26.6–33.0)
MCHC: 35.2 g/dL (ref 31.5–35.7)
MCV: 93 fL (ref 79–97)
Platelets: 201 10*3/uL (ref 150–450)
RBC: 4.86 x10E6/uL (ref 4.14–5.80)
RDW: 12.4 % (ref 11.6–15.4)
WBC: 7.8 10*3/uL (ref 3.4–10.8)

## 2023-11-09 LAB — COMPREHENSIVE METABOLIC PANEL
ALT: 80 [IU]/L — ABNORMAL HIGH (ref 0–44)
AST: 135 [IU]/L — ABNORMAL HIGH (ref 0–40)
Albumin: 4.8 g/dL (ref 4.1–5.1)
Alkaline Phosphatase: 120 [IU]/L (ref 44–121)
BUN/Creatinine Ratio: 11 (ref 9–20)
BUN: 6 mg/dL (ref 6–24)
Bilirubin Total: 0.4 mg/dL (ref 0.0–1.2)
CO2: 18 mmol/L — ABNORMAL LOW (ref 20–29)
Calcium: 9.9 mg/dL (ref 8.7–10.2)
Chloride: 97 mmol/L (ref 96–106)
Creatinine, Ser: 0.55 mg/dL — ABNORMAL LOW (ref 0.76–1.27)
Globulin, Total: 3.8 g/dL (ref 1.5–4.5)
Glucose: 104 mg/dL — ABNORMAL HIGH (ref 70–99)
Potassium: 4.9 mmol/L (ref 3.5–5.2)
Sodium: 136 mmol/L (ref 134–144)
Total Protein: 8.6 g/dL — ABNORMAL HIGH (ref 6.0–8.5)
eGFR: 128 mL/min/{1.73_m2} (ref >59)

## 2023-11-09 LAB — LIPID PANEL
Chol/HDL Ratio: 21.3 ratio — ABNORMAL HIGH (ref 0.0–5.0)
Cholesterol, Total: 468 mg/dL — ABNORMAL HIGH (ref 100–199)
HDL: 22 mg/dL — ABNORMAL LOW (ref >39)
Triglycerides: 2410 mg/dL (ref 0–149)

## 2023-11-10 ENCOUNTER — Other Ambulatory Visit: Payer: Self-pay | Admitting: Internal Medicine

## 2023-11-10 DIAGNOSIS — E781 Pure hyperglyceridemia: Secondary | ICD-10-CM

## 2023-11-10 DIAGNOSIS — Z114 Encounter for screening for human immunodeficiency virus [HIV]: Secondary | ICD-10-CM

## 2023-11-10 DIAGNOSIS — Z1159 Encounter for screening for other viral diseases: Secondary | ICD-10-CM

## 2023-11-10 DIAGNOSIS — R7989 Other specified abnormal findings of blood chemistry: Secondary | ICD-10-CM

## 2023-11-10 NOTE — Progress Notes (Signed)
Let patient know that his cholesterol level known as triglyceride is quite elevated at 2400.  Normal is less than 150.  I would like for him to return to the lab fasting to have repeat level checked. -Liver function tests are abnormal.  Please inquire if he drinks alcoholic beverages including beers and if so how often and how much.  Please return to the lab for additional testing for viruses that can infect the liver including hepatitis A, B and C.  We will also screen for HIV. Orders entered.

## 2023-11-12 ENCOUNTER — Telehealth: Payer: Self-pay

## 2023-11-12 NOTE — Telephone Encounter (Signed)
-----   Message from [facility] Ora Mcnatt M sent at 11/11/2023  8:41 AM EST -----  ----- Message ----- From: Marcine Matar, MD Sent: 11/10/2023   9:12 AM EST To: Johna Roles, CMA  Let patient know that his cholesterol level known as triglyceride is quite elevated at 2400.  Normal is less than 150.  I would like for him to return to the lab fasting to have repeat level checked. -Liver function tests are abnormal.  Please inquire if he drinks alcoholic beverages including beers and if so how often and how much.  Please return to the lab for additional testing for viruses that can infect the liver including hepatitis A, B and C.  We will also screen for HIV. Orders entered.

## 2023-11-12 NOTE — Telephone Encounter (Signed)
Pt sister was called and vm was left, Information has been sent to nurse pool.    Patient number is out of service  Interpreter id [phone]

## 2023-11-13 NOTE — Telephone Encounter (Signed)
FYI. Called patient but no answer. Unable to LVM due to VM not being set up. Letter sent via mail.

## 2023-11-20 ENCOUNTER — Other Ambulatory Visit: Payer: Self-pay

## 2023-11-29 ENCOUNTER — Other Ambulatory Visit: Payer: Self-pay

## 2023-11-29 ENCOUNTER — Ambulatory Visit (HOSPITAL_COMMUNITY)
Admission: EM | Admit: 2023-11-29 | Discharge: 2023-11-29 | Disposition: A | Discharge status: Home / Self Care | Payer: Self-pay | Attending: Internal Medicine | Admitting: Internal Medicine

## 2023-11-29 ENCOUNTER — Encounter (HOSPITAL_COMMUNITY): Payer: Self-pay

## 2023-11-29 ENCOUNTER — Ambulatory Visit: Payer: Self-pay | Admitting: *Deleted

## 2023-11-29 DIAGNOSIS — M25461 Effusion, right knee: Secondary | ICD-10-CM

## 2023-11-29 MED ORDER — PREDNISONE 20 MG PO TABS
20.0000 mg | ORAL_TABLET | Freq: Every day | ORAL | 0 refills | Status: DC
  Filled 2023-11-29: qty 5, 5d supply, fill #0

## 2023-11-29 MED ORDER — METHYLPREDNISOLONE SODIUM SUCC 125 MG IJ SOLR
INTRAMUSCULAR | Status: AC
  Filled 2023-11-29: qty 2

## 2023-11-29 MED ORDER — METHYLPREDNISOLONE SODIUM SUCC 125 MG IJ SOLR
60.0000 mg | Freq: Once | INTRAMUSCULAR | Status: AC
  Administered 2023-11-29: 60 mg via INTRAMUSCULAR

## 2023-11-29 MED ORDER — TRAMADOL HCL 50 MG PO TABS: 50.0000 mg | ORAL_TABLET | Freq: Four times a day (QID) | ORAL | 0 refills | Status: DC | PRN

## 2023-11-29 MED ORDER — TRAMADOL HCL 50 MG PO TABS
50.0000 mg | ORAL_TABLET | Freq: Four times a day (QID) | ORAL | 0 refills | Status: DC
  Filled 2023-11-29: qty 10, 3d supply, fill #0

## 2023-11-29 NOTE — ED Provider Notes (Signed)
[facility]    CSN: [id number] Arrival date & time: 11/29/23  1310      History   Chief Complaint Chief Complaint  Patient presents with   Knee Pain    HPI Angel Robles is a 42 y.o. male who presents with R knee pain and swelling x 2 days. Has taken Tylenol but has not helped. 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 His pain is provoked with walking.Has never had this problem before. He denies drinking alcohol, or eating cold cuts. Has not taken his BP med today since he has not picked it up yet. Denies redness, warmth or injuring his knee.     Past Medical History:  Diagnosis Date   Asthma    Hypertension     Patient Active Problem List   Diagnosis Date Noted   Erectile dysfunction 11/08/2023   Essential hypertension 11/08/2023    Past Surgical History:  Procedure Laterality Date   NO PAST SURGERIES         Home Medications    Prior to Admission medications   Medication Sig Start Date End Date Taking? Authorizing Provider  predniSONE (DELTASONE) 20 MG tablet Take 1 tablet (20 mg total) by mouth daily with breakfast. 11/30/23  Yes Rodriguez-Southworth, Nettie Elm, PA-C  traMADol (ULTRAM) 50 MG tablet Take 1 tablet (50 mg total) by mouth every 6 (six) hours as needed. 11/29/23  Yes Rodriguez-Southworth, Nettie Elm, PA-C  amLODipine (NORVASC) 5 MG tablet Take 1 tablet (5 mg total) by mouth daily. 11/08/23   Marcine Matar, MD  sildenafil (VIAGRA) 50 MG tablet Take 1 tablet by mouth 30-60 minutes prior to intercourse.  Limit use to 1 tab/24 hrs. 11/08/23   Marcine Matar, MD    Family History Family History  Problem Relation Age of Onset   Diabetes Mother     Social History Social History   Tobacco Use   Smoking status: Former   Smokeless tobacco: Never  Vaping Use   Vaping status: Never Used  Substance Use Topics   Alcohol use: Yes    Alcohol/week: 2.0 standard drinks of alcohol    Types: 2  Cans of beer per week    Comment: occ.   Drug use: No     Allergies   Pork-derived products   Review of Systems Review of Systems  As noted in HPI Physical Exam Triage Vital Signs ED Triage Vitals  Encounter Vitals Group     BP 11/29/23 1337 (!) 154/91     Systolic BP Percentile --      Diastolic BP Percentile --      Pulse Rate 11/29/23 1337 91     Resp 11/29/23 1337 16     Temp 11/29/23 1337 99.2 F (37.3 C)     Temp Source 11/29/23 1337 Oral     SpO2 11/29/23 1337 98 %     Weight --      Height --      Head Circumference --      Peak Flow --      Pain Score 11/29/23 1338 8     Pain Loc --      Pain Education --      Exclude from Growth Chart --    No data found.  Updated Vital Signs BP (!) 154/91 (BP Location: Left Arm)   Pulse 91   Temp 99.2 F (37.3 C) (Oral)   Resp 16  SpO2 98%   Visual Acuity Right Eye Distance:   Left Eye Distance:   Bilateral Distance:    Right Eye Near:   Left Eye Near:    Bilateral Near:     Physical Exam Vitals and nursing note reviewed.  Constitutional:      General: He is not in acute distress.    Appearance: He is normal weight. He is not toxic-appearing.     Comments: He is sitting on a wheelchair since it hurts him too much to walk on it  HENT:     Right Ear: External ear normal.     Left Ear: External ear normal.  Eyes:     General: No scleral icterus.    Conjunctiva/sclera: Conjunctivae normal.  Cardiovascular:     Rate and Rhythm: Normal rate and regular rhythm.     Heart sounds: Normal heart sounds.  Pulmonary:     Effort: Pulmonary effort is normal.  Musculoskeletal:     Cervical back: Neck supple.     Right lower leg: No edema.     Left lower leg: No edema.     Comments: R KNEE- with suprapatellar effusion. Has tenderness on the upper border of the patella where he has some thickening palpated. ROM is stiff due to swelling and pain with ROM. The knee is not hot or red.   Skin:    General: Skin is  warm and dry.     Findings: No bruising, erythema, lesion or rash.  Neurological:     Mental Status: He is alert and oriented to person, place, and time.     Gait: Gait normal.  Psychiatric:        Mood and Affect: Mood normal.        Behavior: Behavior normal.        Thought Content: Thought content normal.        Judgment: Judgment normal.      UC Treatments / Results  Labs (all labs ordered are listed, but only abnormal results are displayed) Labs Reviewed - No data to display  EKG   Radiology No results found.  Procedures Procedures (including critical care time)  Medications Ordered in UC Medications  methylPREDNISolone sodium succinate (SOLU-MEDROL) 125 mg/2 mL injection 60 mg (has no administration in time range)    Initial Impression / Assessment and Plan / UC Course  I have reviewed the triage vital signs and the nursing notes. He was given solumedrol 60 mg IM here I prescribed him Prednisone 20 mg every day for 5 days to start tomorrow. I also prescribed him Tramadol for pain. He was told if this did not improve by next week, to FU with ortho.   Final Clinical Impressions(s) / UC Diagnoses   Final diagnoses:  Effusion of right knee     Discharge Instructions      Tome su medicina de precion todos los dias  Comienza las pastillas de cortisona manana Descanse por dos dias antes de retornar al trabajo Si no te mejoras, vaya a ver un medico de huesos     ED Prescriptions     Medication Sig Dispense Auth. Provider   predniSONE (DELTASONE) 20 MG tablet Take 1 tablet (20 mg total) by mouth daily with breakfast. 5 tablet Rodriguez-Southworth, Nettie Elm, PA-C   traMADol (ULTRAM) 50 MG tablet Take 1 tablet (50 mg total) by mouth every 6 (six) hours as needed. 10 tablet Rodriguez-Southworth, Nettie Elm, PA-C      I have reviewed the PDMP during  this encounter.   Garey Ham, PA-C 11/29/23 1404

## 2023-11-29 NOTE — ED Triage Notes (Signed)
Pt c/o rt knee pain and swelling x2 days. Denies injury. States took tylenol with no relief.

## 2023-11-29 NOTE — Discharge Instructions (Addendum)
Tome su medicina de precion todos los dias  Comienza las pastillas de cortisona manana Descanse por dos dias antes de retornar al trabajo Si no te mejoras, vaya a ver un medico de [employer]

## 2023-11-29 NOTE — Telephone Encounter (Signed)
Reason for Disposition  [1] MODERATE pain (e.g., interferes with normal activities, limping) AND [2] present > 3 days  Answer Assessment - Initial Assessment Questions 1. LOCATION and RADIATION: "Where is the pain located?"      Pt's wife calling in with Spanish interpreter Ivin Booty.   I asked if he could be put on the line.    He got on the line.     Having pain in right knee.  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  I did not fall.  Tues. Night it started hurting and has gotten worse.   Today I put ointment on it and took a pill for pain.   It feels like a mass over my knee.    2. QUALITY: "What does the pain feel like?"  (e.g., sharp, dull, aching, burning)     Not asked 3. SEVERITY: "How bad is the pain?" "What does it keep you from doing?"   (Scale 1-10; or mild, moderate, severe)   -  MILD (1-3): doesn't interfere with normal activities    -  MODERATE (4-7): interferes with normal activities (e.g., work or school) or awakens from sleep, limping    -  SEVERE (8-10): excruciating pain, unable to do any normal activities, unable to walk     6/10 now 4. ONSET: "When did the pain start?" "Does it come and go, or is it there all the time?"     Tues. 5. RECURRENT: "Have you had this pain before?" If Yes, ask: "When, and what happened then?"     No 6. SETTING: "Has there been any recent work, exercise or other activity that involved that part of the body?"      No 7. AGGRAVATING FACTORS: "What makes the knee pain worse?" (e.g., walking, climbing stairs, running)     Moving it 8. ASSOCIATED SYMPTOMS: "Is there any swelling or redness of the knee?"     A little swollen but not red 9. OTHER SYMPTOMS: "Do you have any other symptoms?" (e.g., chest pain, difficulty breathing, fever, calf pain)     No 10. PREGNANCY: "Is there any chance you are pregnant?" "When was your last menstrual period?"       N?A  Protocols used: Knee Pain-A-AH

## 2023-11-29 NOTE — Telephone Encounter (Signed)
  Chief Complaint: Right knee pain Symptoms: Mild swelling in knee and pain.   Feels like a vein is inflamed.  Frequency: Started Tues. Pertinent Negatives: Patient denies falls or injuries Disposition: [] ED /[] Urgent Care (no appt availability in office) / [x] Appointment(In office/virtual)/ []  [facility]/ [] Home Care/ [] Refused Recommended Disposition /[] [facility]/ []  Follow-up with PCP Additional Notes: Appt made with Georgian Co, PA-C for 12/10/2023 at 8:50.     He called in with a spanish interpreter Ivin Booty  [phone].

## 2023-12-09 NOTE — Progress Notes (Deleted)
Patient ID: Jaeger Arneson, male   DOB: July 01, 1981, 42 y.o.   MRN: [id number]  From UC 11/29/2023: Chanc Berkes is a 42 y.o. male who presents with R knee pain and swelling x 2 days. Has taken Tylenol but has not helped. 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 His pain is provoked with walking.Has never had this problem before. He denies drinking alcohol, or eating cold cuts. Has not taken his BP med today since he has not picked it up yet. Denies redness, warmth or injuring his knee.   He was given solumedrol 60 mg IM here I prescribed him Prednisone 20 mg every day for 5 days to start tomorrow. I also prescribed him Tramadol for pain. He was told if this did not improve by next week, to FU with ortho.

## 2023-12-10 ENCOUNTER — Ambulatory Visit: Payer: Self-pay | Admitting: Physician Assistant

## 2023-12-30 ENCOUNTER — Other Ambulatory Visit: Payer: Self-pay

## 2023-12-30 ENCOUNTER — Ambulatory Visit: Payer: Self-pay | Attending: Internal Medicine | Admitting: Internal Medicine

## 2023-12-30 VITALS — BP 129/75 | HR 68 | Temp 98.6°F | Ht 66.0 in | Wt 150.0 lb

## 2023-12-30 DIAGNOSIS — I10 Essential (primary) hypertension: Secondary | ICD-10-CM

## 2023-12-30 DIAGNOSIS — Z114 Encounter for screening for human immunodeficiency virus [HIV]: Secondary | ICD-10-CM

## 2023-12-30 DIAGNOSIS — E782 Mixed hyperlipidemia: Secondary | ICD-10-CM | POA: Insufficient documentation

## 2023-12-30 DIAGNOSIS — R7989 Other specified abnormal findings of blood chemistry: Secondary | ICD-10-CM | POA: Insufficient documentation

## 2023-12-30 DIAGNOSIS — F1090 Alcohol use, unspecified, uncomplicated: Secondary | ICD-10-CM | POA: Insufficient documentation

## 2023-12-30 MED ORDER — AMLODIPINE BESYLATE 5 MG PO TABS
5.0000 mg | ORAL_TABLET | Freq: Every day | ORAL | 1 refills | Status: DC
  Filled 2023-12-30: qty 90, 90d supply, fill #0
  Filled 2024-02-21: qty 30, 30d supply, fill #0
  Filled 2024-04-03: qty 30, 30d supply, fill #1
  Filled 2024-05-05: qty 30, 30d supply, fill #2
  Filled 2024-06-05: qty 30, 30d supply, fill #3
  Filled 2024-07-10: qty 30, 30d supply, fill #4
  Filled 2024-08-10: qty 30, 30d supply, fill #5

## 2023-12-30 NOTE — Progress Notes (Signed)
 Patient ID: Angel Robles, male    DOB: 04-29-1981  MRN: [id number]  CC: Hypertension (HTN f/u. Med refill. /Discuss liver function results/Yes to flu vax)   Subjective: Angel Robles is a 43 y.o. male who presents for chronic ds management. His concerns today include:  Patient with history of the ED, HTN, hypertriglyceridemia.  AMN Language interpreter used during this encounter. #Annsoffy Edrie [id number]  HTN:  started on Norvasc  5 mg on visit 6 wks ago; reports taking consistently Q a.m but did not take as yet for the morning. Limits salt in foods No device to check BP at home.  Had blood test on last visit but was unable to be reached to go over results. TG level was 2400, T.chol 468 Elev AST/ALT 135/80 CBC was nl Inquired about ETOH use.  Reports he was drinking up to five 12 oz beers/day. Quit completely 1 mth ago after being seen in ER for swelling of the knee; states he was told it was due to uric acid. No hx of blood transfusions or IVDU.  Patient Active Problem List   Diagnosis Date Noted   Erectile dysfunction 11/08/2023   Essential hypertension 11/08/2023     Current Outpatient Medications on File Prior to Visit  Medication Sig Dispense Refill   amLODipine  (NORVASC ) 5 MG tablet Take 1 tablet (5 mg total) by mouth daily. 90 tablet 1   predniSONE  (DELTASONE ) 20 MG tablet Take 1 tablet (20 mg total) by mouth daily with breakfast. (Patient not taking: Reported on 12/30/2023) 5 tablet 0   sildenafil  (VIAGRA ) 50 MG tablet Take 1 tablet by mouth 30-60 minutes prior to intercourse.  Limit use to 1 tab/24 hrs. (Patient not taking: Reported on 12/30/2023) 10 tablet 3   traMADol  (ULTRAM ) 50 MG tablet Take 1 tablet (50 mg total) by mouth every 6 (six) hours as needed. (Patient not taking: Reported on 12/30/2023) 10 tablet 0   traMADol  (ULTRAM ) 50 MG tablet Take 1 tablet (50 mg total) by mouth every 6 (six) hours. (Patient not taking: Reported on 12/30/2023) 10 tablet 0   No current  facility-administered medications on file prior to visit.    Allergies  Allergen Reactions   Pork-Derived Products Rash    Social History   Socioeconomic History   Marital status: Single    Spouse name: Not on file   Number of children: Not on file   Years of education: Not on file   Highest education level: Not on file  Occupational History   Not on file  Tobacco Use   Smoking status: Former   Smokeless tobacco: Never  Vaping Use   Vaping status: Never Used  Substance and Sexual Activity   Alcohol use: Yes    Alcohol/week: 2.0 standard drinks of alcohol    Types: 2 Cans of beer per week    Comment: occ.   Drug use: No   Sexual activity: Yes    Partners: Female  Other Topics Concern   Not on file  Social History Narrative   Not on file   Social Drivers of Health   Financial Resource Strain: Not on file  Food Insecurity: Not on file  Transportation Needs: Not on file  Physical Activity: Sufficiently Active (09/05/2018)   Exercise Vital Sign    Days of Exercise per Week: 5 days    Minutes of Exercise per Session: 90 min  Stress: Stress Concern Present (09/05/2018)   [employer] of Occupational Health - Occupational Stress Questionnaire  Feeling of Stress : To some extent  Social Connections: Not on file  Intimate Partner Violence: Not on file    Family History  Problem Relation Age of Onset   Diabetes Mother     Past Surgical History:  Procedure Laterality Date   NO PAST SURGERIES      ROS: Review of Systems Negative except as stated above  PHYSICAL EXAM: BP 129/75 (BP Location: Left Arm, Patient Position: Sitting, Cuff Size: Normal)   Pulse 68   Temp 98.6 F (37 C) (Oral)   Ht 5' 6 (1.676 m)   Wt 150 lb (68 kg)   SpO2 99%   BMI 24.21 kg/m   Physical Exam   General appearance - alert, well appearing, and in no distress Mental status - normal mood, behavior, speech, dress, motor activity, and thought processes Neck - supple, no  significant adenopathy Chest - clear to auscultation, no wheezes, rales or rhonchi, symmetric air entry Heart - normal rate, regular rhythm, normal S1, S2, no murmurs, rubs, clicks or gallops Extremities - peripheral pulses normal, no pedal edema, no clubbing or cyanosis Abdomen:  no organomegaly    Latest Ref Rng & Units 11/08/2023    9:40 AM 08/29/2014   11:17 AM  CMP  Glucose 70 - 99 mg/dL 895  893   BUN 6 - 24 mg/dL 6  8   Creatinine 9.23 - 1.27 mg/dL 9.44  9.41   Sodium 865 - 144 mmol/L 136  141   Potassium 3.5 - 5.2 mmol/L 4.9  4.1   Chloride 96 - 106 mmol/L 97  102   CO2 20 - 29 mmol/L 18  20   Calcium 8.7 - 10.2 mg/dL 9.9  9.9   Total Protein 6.0 - 8.5 g/dL 8.6  8.3   Total Bilirubin 0.0 - 1.2 mg/dL 0.4  0.5   Alkaline Phos 44 - 121 IU/L 120  71   AST 0 - 40 IU/L 135  26   ALT 0 - 44 IU/L 80  23    Lipid Panel     Component Value Date/Time   CHOL 468 (H) 11/08/2023 0940   TRIG 2,410 (HH) 11/08/2023 0940   HDL 22 (L) 11/08/2023 0940   CHOLHDL 21.3 (H) 11/08/2023 0940   LDLCALC Comment (A) 11/08/2023 0940    CBC    Component Value Date/Time   WBC 7.8 11/08/2023 0940   WBC 9.9 08/29/2014 1117   RBC 4.86 11/08/2023 0940   RBC 4.92 08/29/2014 1117   HGB 15.9 11/08/2023 0940   HCT 45.2 11/08/2023 0940   PLT 201 11/08/2023 0940   MCV 93 11/08/2023 0940   MCH 32.7 11/08/2023 0940   MCH 31.7 08/29/2014 1117   MCHC 35.2 11/08/2023 0940   MCHC 35.2 08/29/2014 1117   RDW 12.4 11/08/2023 0940    ASSESSMENT AND PLAN: 1. Essential hypertension (Primary) At goal.  Continue Norvasc  5 mg daily. - amLODipine  (NORVASC ) 5 MG tablet; Take 1 tablet (5 mg total) by mouth daily.  Dispense: 90 tablet; Refill: 1  2. Mixed hyperlipidemia Encourage healthy eating habits.  Increase alcohol use likely playing a role with the increased triglyceride level.  It is good that he has stopped drinking.  He is fasting today for repeat lipid profile - Lipid panel  3. Abnormal LFTs -  Hepatic Function Panel - Hepatitis C Antibody - Hepatitis B surface antigen - Hepatitis B surface antibody,quantitative  4. Alcohol use disorder Commended him that he quit drinking totally.  If he were to drink in the future, I advised no more than one 12 oz beer a day.  Went over health risks associated with excessive alcohol use.  5. Screening for HIV (human immunodeficiency virus) - HIV antibody (with reflex)     Patient was given the opportunity to ask questions.  Patient verbalized understanding of the plan and was able to repeat key elements of the plan.   This documentation was completed using Paediatric nurse.  Any transcriptional errors are unintentional.  No orders of the defined types were placed in this encounter.    Requested Prescriptions   Pending Prescriptions Disp Refills   amLODipine  (NORVASC ) 5 MG tablet 90 tablet 1    Sig: Take 1 tablet (5 mg total) by mouth daily.    No follow-ups on file.  Barnie Louder, MD, FACP

## 2023-12-31 LAB — HEPATIC FUNCTION PANEL
ALT: 33 [IU]/L (ref 0–44)
AST: 23 [IU]/L (ref 0–40)
Albumin: 4.9 g/dL (ref 4.1–5.1)
Alkaline Phosphatase: 77 [IU]/L (ref 44–121)
Bilirubin Total: 0.6 mg/dL (ref 0.0–1.2)
Bilirubin, Direct: 0.17 mg/dL (ref 0.00–0.40)
Total Protein: 7.5 g/dL (ref 6.0–8.5)

## 2023-12-31 LAB — LIPID PANEL
Chol/HDL Ratio: 3.4 {ratio} (ref 0.0–5.0)
Cholesterol, Total: 212 mg/dL — ABNORMAL HIGH (ref 100–199)
HDL: 63 mg/dL (ref >39)
LDL Chol Calc (NIH): 123 mg/dL — ABNORMAL HIGH (ref 0–99)
Triglycerides: 149 mg/dL (ref 0–149)
VLDL Cholesterol Cal: 26 mg/dL (ref 5–40)

## 2023-12-31 LAB — HEPATITIS B SURFACE ANTIBODY, QUANTITATIVE: Hepatitis B Surf Ab Quant: 4.7 m[IU]/mL — ABNORMAL LOW

## 2023-12-31 LAB — HIV ANTIBODY (ROUTINE TESTING W REFLEX): HIV Screen 4th Generation wRfx: NONREACTIVE

## 2023-12-31 LAB — HEPATITIS C ANTIBODY: Hep C Virus Ab: NONREACTIVE

## 2023-12-31 LAB — HEPATITIS B SURFACE ANTIGEN: Hepatitis B Surface Ag: NEGATIVE

## 2024-01-01 NOTE — Progress Notes (Signed)
 Cholesterol levels have significantly improved.  Triglyceride level has normalized. -Liver function test normalized.  He should continue to avoid excess drinking of beers as was discussed on his recent visit to prevent damage to the liver. -Screen for HIV negative. Screen for hepatitis C and hepatitis B infection negative. -Not adequately vaccinated against hepatitis B.  If he would like to start the hepatitis B vaccine series, please schedule an appointment with the RN to get his first shot and then second shot 1 month later.  The rest of this is for my information. The 10-year ASCVD risk score (Arnett DK, et al., 2019) is: 1.1%   Values used to calculate the score:     Age: 53 years     Sex: Male     Is Non-Hispanic African American: No     Diabetic: No     Tobacco smoker: No     Systolic Blood Pressure: 129 mmHg     Is BP treated: Yes     HDL Cholesterol: 63 mg/dL     Total Cholesterol: 212 mg/dL .

## 2024-01-02 ENCOUNTER — Other Ambulatory Visit (HOSPITAL_COMMUNITY): Payer: Self-pay

## 2024-01-02 ENCOUNTER — Other Ambulatory Visit: Payer: Self-pay

## 2024-01-02 ENCOUNTER — Emergency Department (HOSPITAL_COMMUNITY)
Admission: EM | Admit: 2024-01-02 | Discharge: 2024-01-02 | Disposition: A | Discharge status: Home / Self Care | Payer: Self-pay | Attending: Emergency Medicine | Admitting: Emergency Medicine

## 2024-01-02 ENCOUNTER — Emergency Department (HOSPITAL_COMMUNITY): Payer: Self-pay

## 2024-01-02 DIAGNOSIS — J45909 Unspecified asthma, uncomplicated: Secondary | ICD-10-CM | POA: Insufficient documentation

## 2024-01-02 DIAGNOSIS — I10 Essential (primary) hypertension: Secondary | ICD-10-CM | POA: Insufficient documentation

## 2024-01-02 DIAGNOSIS — Z79899 Other long term (current) drug therapy: Secondary | ICD-10-CM | POA: Insufficient documentation

## 2024-01-02 DIAGNOSIS — M10061 Idiopathic gout, right knee: Secondary | ICD-10-CM | POA: Insufficient documentation

## 2024-01-02 LAB — SYNOVIAL CELL COUNT + DIFF, W/ CRYSTALS
Eosinophils-Synovial: 0 % (ref 0–1)
Lymphocytes-Synovial Fld: 0 % (ref 0–20)
Monocyte-Macrophage-Synovial Fluid: 7 % — ABNORMAL LOW (ref 50–90)
Neutrophil, Synovial: 93 % — ABNORMAL HIGH (ref 0–25)
WBC, Synovial: 60250 /mm3 — ABNORMAL HIGH (ref 0–200)

## 2024-01-02 MED ORDER — LIDOCAINE-EPINEPHRINE (PF) 2 %-1:200000 IJ SOLN
10.0000 mL | Freq: Once | INTRAMUSCULAR | Status: AC
  Administered 2024-01-02: 10 mL
  Filled 2024-01-02: qty 20

## 2024-01-02 MED ORDER — INDOMETHACIN 50 MG PO CAPS
50.0000 mg | ORAL_CAPSULE | Freq: Two times a day (BID) | ORAL | 0 refills | Status: DC
Start: 2024-01-02 — End: 2024-01-17
  Filled 2024-01-02 (×3): qty 20, 10d supply, fill #0

## 2024-01-02 MED ORDER — PREDNISONE 20 MG PO TABS
40.0000 mg | ORAL_TABLET | Freq: Every day | ORAL | 0 refills | Status: DC
  Filled 2024-01-02 (×2): qty 6, 3d supply, fill #0

## 2024-01-02 NOTE — ED Provider Notes (Signed)
EMERGENCY DEPARTMENT AT [facility] Provider Note   CSN: [id number] Arrival date & time: 01/02/24  4098     History  Chief Complaint  Patient presents with   Knee Pain    Angel Robles is a 43 y.o. male.  Patient is a 43 year old male with a history of hypertension, asthma who is presenting today due to recurrent pain and swelling in his knee.  Patient reports that in December he had pain and swelling of his knee that became severe and he followed up at urgent care and had fluid drawn off.  At that time they gave him a shot of Solu-Medrol and sent him home with prednisone.  Ultimately he reports the pain went away however yesterday while he was at work he started noticing the pain which is quickly gotten worse to the point today he was unable to walk on his knee because it is so swollen and painful.  He has not had any fever, redness or drainage from his knee.  He denies any injury.  However in his job in [employer] he does go up and down a lot of stairs.  He has not currently on any medication other than Norvasc.  The history is provided by the patient and the spouse. The history is limited by a language barrier. A language interpreter was used.  Knee Pain      Home Medications Prior to Admission medications   Medication Sig Start Date End Date Taking? Authorizing Provider  indomethacin (INDOCIN) 50 MG capsule Take 1 capsule (50 mg total) by mouth 2 (two) times daily with a meal. 01/02/24  Yes Jemila Camille, Alphonzo Lemmings, MD  predniSONE (DELTASONE) 20 MG tablet Take 2 tablets (40 mg total) by mouth daily. 01/02/24  Yes Claudine Stallings, Alphonzo Lemmings, MD  amLODipine (NORVASC) 5 MG tablet Take 1 tablet (5 mg total) by mouth daily. 12/30/23   Marcine Matar, MD  sildenafil (VIAGRA) 50 MG tablet Take 1 tablet by mouth 30-60 minutes prior to intercourse.  Limit use to 1 tab/24 hrs. Patient not taking: Reported on 12/30/2023 11/08/23   Marcine Matar, MD  traMADol (ULTRAM) 50 MG tablet  Take 1 tablet (50 mg total) by mouth every 6 (six) hours as needed. Patient not taking: Reported on 12/30/2023 11/29/23   Rodriguez-Southworth, Nettie Elm, PA-C  traMADol (ULTRAM) 50 MG tablet Take 1 tablet (50 mg total) by mouth every 6 (six) hours. Patient not taking: Reported on 12/30/2023 11/29/23         Allergies    Pork-derived products    Review of Systems   Review of Systems  Physical Exam Updated Vital Signs BP 137/83 (BP Location: Right Arm)   Pulse 79   Temp 99.4 F (37.4 C) (Oral)   Resp 16   SpO2 100%  Physical Exam Vitals and nursing note reviewed.  Constitutional:      General: He is not in acute distress.    Appearance: He is well-developed.  HENT:     Head: Normocephalic and atraumatic.  Eyes:     Conjunctiva/sclera: Conjunctivae normal.     Pupils: Pupils are equal, round, and reactive to light.  Cardiovascular:     Rate and Rhythm: Normal rate and regular rhythm.     Pulses: Normal pulses.     Heart sounds: No murmur heard. Pulmonary:     Effort: Pulmonary effort is normal. No respiratory distress.     Breath sounds: Normal breath sounds. No wheezing or rales.  Musculoskeletal:  General: Tenderness present. Normal range of motion.     Cervical back: Normal range of motion and neck supple.     Right knee: Swelling and effusion present.     Comments: Significant suprapatellar swelling noted of the knee with knee held in slight flexion.  Pain with range of motion.  No erythema but mild warmth noted  Skin:    General: Skin is warm and dry.     Findings: No erythema or rash.  Neurological:     Mental Status: He is alert and oriented to person, place, and time. Mental status is at baseline.  Psychiatric:        Behavior: Behavior normal.     ED Results / Procedures / Treatments   Labs (all labs ordered are listed, but only abnormal results are displayed) Labs Reviewed  SYNOVIAL CELL COUNT + DIFF, W/ CRYSTALS - Abnormal; Notable for the following  components:      Result Value   Appearance-Synovial TURBID (*)    WBC, Synovial 60,250 (*)    Neutrophil, Synovial 93 (*)    Monocyte-Macrophage-Synovial Fluid 7 (*)    All other components within normal limits  BODY FLUID CULTURE W GRAM STAIN    EKG None  Radiology DG Knee Complete 4 Views Right Result Date: 01/02/2024 CLINICAL DATA:  Right knee pain and swelling. Previously went to urgent care, drawn off fluid, and given injection into knee. Started having swelling and pain at work again. EXAM: RIGHT KNEE - COMPLETE 4+ VIEW COMPARISON:  None Available. FINDINGS: Normal bone mineralization. Joint spaces are preserved. Mild superior patellar degenerative spurring. Normal patellar height. Mild to moderate joint effusion. No acute fracture or dislocation. IMPRESSION: 1. Mild superior patellar degenerative spurring. 2. Mild to moderate joint effusion. Electronically Signed   By: Neita Garnet M.D.   On: 01/02/2024 11:38    Procedures .Joint Aspiration/Arthrocentesis  Date/Time: 01/02/2024 11:15 AM  Performed by: Gwyneth Sprout, MD Authorized by: Gwyneth Sprout, MD   Consent:    Consent obtained:  Verbal   Consent given by:  Patient   Risks, benefits, and alternatives were discussed: yes     Risks discussed:  Infection, pain and incomplete drainage Universal protocol:    Procedure explained and questions answered to patient or proxy's satisfaction: yes     Patient identity confirmed:  Verbally with patient Location:    Location:  Knee   Knee:  R knee Anesthesia:    Anesthesia method:  Local infiltration   Local anesthetic:  Lidocaine 2% WITH epi Procedure details:    Preparation: Patient was prepped and draped in usual sterile fashion     Needle gauge:  18 G   Ultrasound guidance: no     Approach:  Lateral   Aspirate amount:  70   Aspirate characteristics:  Clear and yellow   Steroid injected: no     Specimen collected: no   Post-procedure details:    Dressing:   Adhesive bandage   Procedure completion:  Tolerated     Medications Ordered in ED Medications  lidocaine-EPINEPHrine (XYLOCAINE W/EPI) 2 %-1:200000 (PF) injection 10 mL (10 mLs Infiltration Given 01/02/24 1041)    ED Course/ Medical Decision Making/ A&P                                 Medical Decision Making Amount and/or Complexity of Data Reviewed Labs: ordered. Decision-making details documented in ED Course. Radiology: ordered and  independent interpretation performed. Decision-making details documented in ED Course.  Risk Prescription drug management.   Patient presenting today with recurrent swelling of his right knee.  Low suspicion for septic joint and suspect most likely related to arthritis However also suspicious for possible gout.  No signs of injury.  Joint aspiration at bedside had 70 mL of fluid.  X-ray and fluid analysis pending.  Patient placed in an Ace wrap.  2:14 PM I independently interpreted patient's labs and synovial fluid showed intracellular monosodium urate crystals with a white count of 60,093 neutrophils most consistent with an inflammatory arthritis consistent with gout.  I have independently visualized and interpreted pt's images today.  X-ray today showed a mild joint effusion.  Radiology also reports mild superior patellar degenerative spurring.  Discussed the results with the patient and his wife.  Patient had blood work done less than 2 months ago that showed a normal renal function.  He only takes amlodipine that should not be causing the gout.  Discussed follow-up with his PCP if he continues to have issues as he may need to be on a long-term suppressive therapy.  Patient was also given dietary restrictions.         Final Clinical Impression(s) / ED Diagnoses Final diagnoses:  Acute idiopathic gout of right knee    Rx / DC Orders ED Discharge Orders          Ordered    indomethacin (INDOCIN) 50 MG capsule  2 times daily with meals         01/02/24 1413    predniSONE (DELTASONE) 20 MG tablet  Daily        01/02/24 1413              Gwyneth Sprout, MD 01/02/24 1416

## 2024-01-02 NOTE — ED Triage Notes (Signed)
Pt comes in with knee pain, swelling. Previous gone to UC drawn off fluid and given an injection into knee. Went back to work. Started having swelling and pain at work again at work

## 2024-01-02 NOTE — Discharge Instructions (Addendum)
The results of the fluid today came back that you have gout.  If you continue to have issues with your knee you need to follow-up with your regular doctor as there is a medication you can be on long-term to prevent gout in the future but you cannot take it when you are having an initial attack.

## 2024-01-05 LAB — BODY FLUID CULTURE W GRAM STAIN: Culture: NO GROWTH

## 2024-01-17 ENCOUNTER — Other Ambulatory Visit: Payer: Self-pay | Admitting: Emergency Medicine

## 2024-01-17 ENCOUNTER — Other Ambulatory Visit: Payer: Self-pay

## 2024-01-17 MED ORDER — INDOMETHACIN 50 MG PO CAPS
50.0000 mg | ORAL_CAPSULE | Freq: Two times a day (BID) | ORAL | 0 refills | Status: AC
  Filled 2024-01-17: qty 14, 7d supply, fill #0

## 2024-01-23 ENCOUNTER — Ambulatory Visit: Payer: Self-pay | Admitting: Physician Assistant

## 2024-01-24 ENCOUNTER — Ambulatory Visit: Payer: Self-pay | Attending: Internal Medicine | Admitting: Internal Medicine

## 2024-01-24 ENCOUNTER — Encounter: Payer: Self-pay | Admitting: Internal Medicine

## 2024-01-24 ENCOUNTER — Other Ambulatory Visit: Payer: Self-pay

## 2024-01-24 VITALS — BP 130/68 | HR 66 | Temp 98.0°F | Ht 66.0 in | Wt 141.0 lb

## 2024-01-24 DIAGNOSIS — Z79899 Other long term (current) drug therapy: Secondary | ICD-10-CM | POA: Insufficient documentation

## 2024-01-24 DIAGNOSIS — M25669 Stiffness of unspecified knee, not elsewhere classified: Secondary | ICD-10-CM | POA: Insufficient documentation

## 2024-01-24 DIAGNOSIS — M25561 Pain in right knee: Secondary | ICD-10-CM | POA: Insufficient documentation

## 2024-01-24 DIAGNOSIS — I10 Essential (primary) hypertension: Secondary | ICD-10-CM | POA: Insufficient documentation

## 2024-01-24 DIAGNOSIS — Z23 Encounter for immunization: Secondary | ICD-10-CM | POA: Insufficient documentation

## 2024-01-24 DIAGNOSIS — M109 Gout, unspecified: Secondary | ICD-10-CM | POA: Insufficient documentation

## 2024-01-24 MED ORDER — COLCHICINE 0.6 MG PO TABS
0.6000 mg | ORAL_TABLET | Freq: Every day | ORAL | 4 refills | Status: DC
  Filled 2024-01-24: qty 30, 30d supply, fill #0
  Filled 2024-02-21: qty 30, 30d supply, fill #1
  Filled 2024-03-23: qty 30, 30d supply, fill #2
  Filled 2024-04-23: qty 30, 30d supply, fill #3
  Filled 2024-05-21: qty 30, 30d supply, fill #4

## 2024-01-24 NOTE — Progress Notes (Signed)
 nu

## 2024-01-24 NOTE — Patient Instructions (Signed)
Gota Gout  La gota es una afeccin que causa la hinchazn dolorosa de las articulaciones. La gota es un tipo de inflamacin de las articulaciones (artritis). Esta afeccin es causada por la acumulacin de cido rico en el cuerpo. El cido rico es una sustancia qumica que se forma cuando el cuerpo descompone sustancias llamadas purinas. Las purinas son importantes para la fabricacin de las protenas del cuerpo. Cuando el cuerpo tiene un exceso de cido rico, se pueden formar cristales con punta y Production manager interior de las articulaciones. Esto provoca dolor e hinchazn. Las crisis de gota pueden ocurrir rpidamente y ser muy dolorosas (gota [place]). Con el tiempo, las crisis pueden afectar ms articulaciones y volverse ms frecuentes (gota crnica). La gota tambin puede provocar la acumulacin de cido rico debajo de la piel y en los riones. Cules son las causas? Esta afeccin es causada por la acumulacin de cido rico en la sangre. Esto puede ocurrir debido a lo siguiente: Los riones no [employer] cantidad suficiente de cido rico de Risk manager. Esta es la causa ms frecuente. El cuerpo produce demasiado cido rico. Esto puede ocurrir con algunos tipos de cncer y tratamientos para Management consultant. Tambin puede ocurrir si el cuerpo est descomponiendo demasiados glbulos rojos (anemia hemoltica). Come demasiados alimentos ricos en purinas. Estos alimentos incluyen vsceras y [employer]. Las bebidas alcohlicas, en especial la cerveza, tambin son ricas en purinas. Neomia Dear crisis de gota puede desencadenarse debido a un traumatismo o Librarian, academic. Qu incrementa el riesgo? Los siguientes factores pueden hacer que sea ms propenso a Aeronautical engineer afeccin: Tener antecedentes familiares de gota. Ser hombre de [employer]. Ser mujer y haber atravesado la menopausia. Usar determinados medicamentos, como aspirina, ciclosporina, diurticos, levodopa y niacina. Haber tenido un trasplante de  rgano. Tener ciertas afecciones, tales como: Obesidad. Intoxicacin con plomo. Enfermedad renal. Una afeccin cutnea llamada psoriasis. Otros factores incluyen los siguientes: Perder peso con demasiada rapidez. Estar deshidratado. Beber alcohol con frecuencia, en especial, cerveza. Tomar frecuentemente bebidas endulzadas con un tipo de azcar llamado fructosa. Cules son los signos o sntomas? Un ataque de gota aguda sucede de repente. Normalmente ocurre solo en Risk analyst. El lugar ms comn es el pulgar del pie. Las crisis a menudo comienzan por la noche. Tambin pueden verse afectadas las articulaciones de los pies, los tobillos, las rodillas, los dedos de la mano, las muecas o los codos. Los sntomas de esta afeccin pueden incluir los siguientes: Dolor intenso. Calor. Hinchazn. Entumecimiento. Dolor a Insurance claims handler. Se puede sentir mucho dolor al tacto en la articulacin afectada. Piel brillosa, roja o morada. Grant Ruts y escalofros. La gota crnica puede provocar sntomas con ms frecuencia. Pueden verse involucradas ms articulaciones. Es posible que tambin tenga bultos blancos o amarillos (tofos) en las manos o los pies, o en otras zonas cercanas a las articulaciones. Cmo se diagnostica? Esta afeccin se diagnostica en funcin de sus sntomas, antecedentes mdicos y de un examen fsico. Pueden hacerle estudios, por ejemplo: Anlisis de sangre para [employer] de cido rico. Psychologist, counselling de lquido sinovial con una aguja delgada (aspiracin) para buscar la presencia de cristales de cido rico. Radiografas para observar la articulacin daada. Cmo se trata? El tratamiento para esta afeccin tiene dos fases: tratar un ataque agudo y prevenir ataques futuros. El tratamiento de la gota aguda puede incluir medicamentos para reducir Chief Technology Officer y la hinchazn, por ejemplo: Antiinflamatorios no esteroideos (AINE), como el ibuprofeno. Corticoesteroides. Estos son  medicamentos antiinflamatorios fuertes que se pueden tomar  por va oral por boca o se pueden inyectar en una articulacin. Colchicina. Este medicamento Estée [employer]uder dolor y la hinchazn cuando se [place] inmediatamente despus de Neomia Dear crisis. Puede administrarse por la boca o por una va intravenosa. El tratamiento preventivo puede incluir lo siguiente: El uso diario de dosis ms bajas de AINE o colchicina. El uso de un medicamento que reduce los niveles de cido rico en la sangre, como el alopurinol. Cambios en la dieta. Es posible que deba consultar a un nutricionista acerca de lo que debe comer y beber para Cytogeneticist. Siga estas indicaciones en su casa: Durante una crisis de gota  Si se lo indican, aplique hielo sobre la zona afectada. Para hacer esto: Ponga el hielo en una bolsa plstica. Coloque una toalla entre la piel y Copy. Aplique el hielo durante 20 minutos, 2 a 3 veces por da. Retire el hielo si la piel se pone de color rojo brillante. Esto es Intel. Si no puede sentir dolor, calor o fro, tiene un mayor riesgo de que se dae la zona. Levante (eleve) la articulacin afectada por encima del nivel del corazn tantas veces como le sea posible. Haga reposo todo el tiempo que pueda. Si la articulacin afectada se encuentra en la pierna, quiz deba usar muletas. Siga las indicaciones del mdico respecto de las restricciones en las comidas o las bebidas. Cmo evitar las crisis de gota en el futuro Siga una dieta baja en purinas como se lo haya indicado el nutricionista o el mdico. Evite alimentos y bebidas con alto contenido de purinas, por ejemplo, hgado, rin, anchoas, esprragos, arenque, hongos, mejillones y Financial controller. Mantenga un peso saludable o pierda peso si tiene sobrepeso. Si quiere perder peso, hable con el mdico. No pierda peso con demasiada rapidez. Comience o siga un programa de actividad fsica como se lo haya indicado el mdico. Comida y bebida Evite tomar  bebidas que contengan fructosa. Beba suficiente lquido para Photographer orina de color amarillo plido. Si bebe alcohol: Limite la cantidad que bebe a lo siguiente: De 0 a 1 medida por da para las mujeres que no estn embarazadas. De 0 a 2 medidas por da para los hombres. Sepa cunta cantidad de alcohol hay en las bebidas. En los [address], una medida equivale a una botella de cerveza de 12 oz (355 ml), un vaso de vino de 5 oz (148 ml) o un vaso de una bebida alcohlica de alta graduacin de 1 oz (44 ml). Indicaciones generales Use los medicamentos de venta libre y los recetados solamente como se lo haya indicado el mdico. Pregntele al mdico si el medicamento recetado le impide conducir o usar [place]. Retome sus actividades normales segn lo indicado por el mdico. Pregntele al mdico qu actividades son seguras para usted. Concurra a todas las visitas de seguimiento. Esto es importante. Dnde obtener ms informacin [employer] of Health (Institutos Nacionales de la Salud: www.[email or web address]://www.myers.net/ Comunquese con un mdico si tiene: Otra crisis de gota. Sntomas de [place] crisis de gota que continan despus de [address] de [place]. Efectos secundarios provocados por los medicamentos. Escalofros o fiebre. Dolor urente al [employer]oco[employer]. Dolor en la parte inferior de la espalda o el abdomen. Busque ayuda de inmediato si: Siente dolor intenso o incontrolable. No puede orinar. Resumen La gota es la hinchazn dolorosa de las articulaciones causada por tener demasiado cido rico en el cuerpo. El lugar ms habitual donde se manifiesta la gota es el dedo gordo del pie, Biomedical engineer tambin  pueden verse afectadas otras articulaciones del cuerpo. Los [employer] y los cambios en la dieta pueden ayudar a Automotive engineer y tratar las crisis de [place]. Esta informacin no tiene Theme park manager el consejo del mdico. Asegrese de hacerle al mdico cualquier pregunta que tenga. Document Revised:  09/22/2021 Document Reviewed: 09/22/2021 Elsevier Patient Education  2024 [employer].

## 2024-01-24 NOTE — Progress Notes (Signed)
 Patient ID: Angel Robles, male    DOB: 1981-11-15  MRN: [id number]  CC: Nutrition Counseling (Nutrition counseling. /R knee inflamation - indomethacin  has helped but still retaining fluid Janiece to Hep B vax)   Subjective: Angel Robles is a 43 y.o. male who presents for UC visit. His concerns today include:  Patient with history of the ED, HTN, hypertriglyceridemia, ETOH use disorder.   AMN Language interpreter used during this encounter. #Angel Robles [id number]  Presents for ER f/u for RT knee pain.  Seen 01/02/24 for swelling and pain in knee.  Arthrocentesis done. Fluid showed intracellular monosodium urate crystals, 60,250 WBC, cx neg. sent home on Indocin  which she finds very helpful.  He only has a few pills left.  The knee is a little stiff.  He worries about pain flaring up again once he is done with the Indocin . He stopped drinking alcoholic beverages completely since mid December  We had done some blood test on him on last visit due to mild elevation of AST and ALT.  Screen negative for hepatitis C.  He is not adequately immunized against hepatitis B.  He is agreeable to receiving the vaccine series. Patient Active Problem List   Diagnosis Date Noted   Mixed hyperlipidemia 12/30/2023   Abnormal LFTs 12/30/2023   Alcohol use disorder 12/30/2023   Erectile dysfunction 11/08/2023   Essential hypertension 11/08/2023     Current Outpatient Medications on File Prior to Visit  Medication Sig Dispense Refill   amLODipine  (NORVASC ) 5 MG tablet Take 1 tablet (5 mg total) by mouth daily. 90 tablet 1   indomethacin  (INDOCIN ) 50 MG capsule Take 1 capsule (50 mg total) by mouth 2 (two) times daily with a meal. 14 capsule 0   predniSONE  (DELTASONE ) 20 MG tablet Take 2 tablets (40 mg total) by mouth daily. (Patient not taking: Reported on 01/24/2024) 6 tablet 0   sildenafil  (VIAGRA ) 50 MG tablet Take 1 tablet by mouth 30-60 minutes prior to intercourse.  Limit use to 1 tab/24 hrs. (Patient not taking:  Reported on 01/24/2024) 10 tablet 3   traMADol  (ULTRAM ) 50 MG tablet Take 1 tablet (50 mg total) by mouth every 6 (six) hours as needed. (Patient not taking: Reported on 01/24/2024) 10 tablet 0   No current facility-administered medications on file prior to visit.    Allergies  Allergen Reactions   Pork-Derived Products Rash    Social History   Socioeconomic History   Marital status: Single    Spouse name: Not on file   Number of children: Not on file   Years of education: Not on file   Highest education level: Not on file  Occupational History   Not on file  Tobacco Use   Smoking status: Former   Smokeless tobacco: Never  Vaping Use   Vaping status: Never Used  Substance and Sexual Activity   Alcohol use: Yes    Alcohol/week: 2.0 standard drinks of alcohol    Types: 2 Cans of beer per week    Comment: occ.   Drug use: No   Sexual activity: Yes    Partners: Female  Other Topics Concern   Not on file  Social History Narrative   Not on file   Social Drivers of Health   Financial Resource Strain: High Risk (12/30/2023)   Overall Financial Resource Strain (CARDIA)    Difficulty of Paying Living Expenses: Hard  Food Insecurity: Food Insecurity Present (12/30/2023)   Hunger Vital Sign    Worried About  Running Out of Food in the Last Year: Sometimes true    Ran Out of Food in the Last Year: Sometimes true  Transportation Needs: No Transportation Needs (12/30/2023)   PRAPARE - Administrator, Civil Service (Medical): No    Lack of Transportation (Non-Medical): No  Physical Activity: Insufficiently Active (12/30/2023)   Exercise Vital Sign    Days of Exercise per Week: 2 days    Minutes of Exercise per Session: 30 min  Stress: No Stress Concern Present (12/30/2023)   [employer] of Occupational Health - Occupational Stress Questionnaire    Feeling of Stress : Only a little  Social Connections: Moderately Isolated (12/30/2023)   Social Connection and Isolation  Panel [NHANES]    Frequency of Communication with Friends and Family: Twice a week    Frequency of Social Gatherings with Friends and Family: Never    Attends Religious Services: More than 4 times per year    Active Member of [employer] or Organizations: No    Attends Banker Meetings: Never    Marital Status: Married  Catering Manager Violence: Not At Risk (12/30/2023)   Humiliation, Afraid, Rape, and Kick questionnaire    Fear of Current or Ex-Partner: No    Emotionally Abused: No    Physically Abused: No    Sexually Abused: No    Family History  Problem Relation Age of Onset   Diabetes Mother     Past Surgical History:  Procedure Laterality Date   NO PAST SURGERIES      ROS: Review of Systems Negative except as stated above  PHYSICAL EXAM: BP 130/68 (BP Location: Left Arm, Patient Position: Sitting, Cuff Size: Normal)   Pulse 66   Temp 98 F (36.7 C) (Oral)   Ht 5' 6 (1.676 m)   Wt 141 lb (64 kg)   SpO2 100%   BMI 22.76 kg/m   Physical Exam   General appearance - alert, well appearing, and in no distress Mental status - normal mood, behavior, speech, dress, motor activity, and thought processes Musculoskeletal -right knee: Patient with mild edema.  No point tenderness.  Good range of motion.     Latest Ref Rng & Units 12/30/2023   11:04 AM 11/08/2023    9:40 AM 08/29/2014   11:17 AM  CMP  Glucose 70 - 99 mg/dL  895  893   BUN 6 - 24 mg/dL  6  8   Creatinine 9.23 - 1.27 mg/dL  9.44  9.41   Sodium 865 - 144 mmol/L  136  141   Potassium 3.5 - 5.2 mmol/L  4.9  4.1   Chloride 96 - 106 mmol/L  97  102   CO2 20 - 29 mmol/L  18  20   Calcium 8.7 - 10.2 mg/dL  9.9  9.9   Total Protein 6.0 - 8.5 g/dL 7.5  8.6  8.3   Total Bilirubin 0.0 - 1.2 mg/dL 0.6  0.4  0.5   Alkaline Phos 44 - 121 IU/L 77  120  71   AST 0 - 40 IU/L 23  135  26   ALT 0 - 44 IU/L 33  80  23    Lipid Panel     Component Value Date/Time   CHOL 212 (H) 12/30/2023 1104   TRIG 149  12/30/2023 1104   HDL 63 12/30/2023 1104   CHOLHDL 3.4 12/30/2023 1104   LDLCALC 123 (H) 12/30/2023 1104    CBC    Component  Value Date/Time   WBC 7.8 11/08/2023 0940   WBC 9.9 08/29/2014 1117   RBC 4.86 11/08/2023 0940   RBC 4.92 08/29/2014 1117   HGB 15.9 11/08/2023 0940   HCT 45.2 11/08/2023 0940   PLT 201 11/08/2023 0940   MCV 93 11/08/2023 0940   MCH 32.7 11/08/2023 0940   MCH 31.7 08/29/2014 1117   MCHC 35.2 11/08/2023 0940   MCHC 35.2 08/29/2014 1117   RDW 12.4 11/08/2023 0940    ASSESSMENT AND PLAN: 1. Gout, arthritis (Primary) Proven with crystals seen on joint fluid. We will put him on colchicine  to take daily to help decrease flareups.  We will check uric acid level.  Encouraged him to remain free of alcoholic beverages.  Try to decrease consumption of red meats.  Printed information in Spanish given on gout - colchicine  0.6 MG tablet; Take 1 tablet (0.6 mg total) by mouth daily.  Dispense: 30 tablet; Refill: 4 - Uric Acid - Comprehensive metabolic panel  2. Need for hepatitis B vaccination Patient agreeable to receiving hepatitis B vaccine series.  First shot given today.    Patient was given the opportunity to ask questions.  Patient verbalized understanding of the plan and was able to repeat key elements of the plan.   This documentation was completed using Paediatric nurse.  Any transcriptional errors are unintentional.  No orders of the defined types were placed in this encounter.    Requested Prescriptions    No prescriptions requested or ordered in this encounter    No follow-ups on file.  Barnie Louder, MD, FACP

## 2024-01-25 LAB — COMPREHENSIVE METABOLIC PANEL
ALT: 21 [IU]/L (ref 0–44)
AST: 29 [IU]/L (ref 0–40)
Albumin: 4.4 g/dL (ref 4.1–5.1)
Alkaline Phosphatase: 73 [IU]/L (ref 44–121)
BUN/Creatinine Ratio: 10 (ref 9–20)
BUN: 6 mg/dL (ref 6–24)
Bilirubin Total: 0.4 mg/dL (ref 0.0–1.2)
CO2: 22 mmol/L (ref 20–29)
Calcium: 8.8 mg/dL (ref 8.7–10.2)
Chloride: 103 mmol/L (ref 96–106)
Creatinine, Ser: 0.59 mg/dL — ABNORMAL LOW (ref 0.76–1.27)
Globulin, Total: 2.7 g/dL (ref 1.5–4.5)
Glucose: 90 mg/dL (ref 70–99)
Potassium: 4.3 mmol/L (ref 3.5–5.2)
Sodium: 139 mmol/L (ref 134–144)
Total Protein: 7.1 g/dL (ref 6.0–8.5)
eGFR: 126 mL/min/{1.73_m2} (ref >59)

## 2024-01-25 LAB — URIC ACID: Uric Acid: 5.2 mg/dL (ref 3.8–8.4)

## 2024-02-21 ENCOUNTER — Ambulatory Visit: Payer: Self-pay | Attending: Internal Medicine

## 2024-02-21 ENCOUNTER — Other Ambulatory Visit: Payer: Self-pay

## 2024-02-21 DIAGNOSIS — Z23 Encounter for immunization: Secondary | ICD-10-CM

## 2024-02-21 NOTE — Progress Notes (Signed)
 2nd HEP B vaccine administered in right deltoid per protocols.  Information sheet given. Patient denies and pain or discomfort at injection site. Tolerated injection well no reaction.

## 2024-03-23 ENCOUNTER — Other Ambulatory Visit: Payer: Self-pay

## 2024-04-02 ENCOUNTER — Ambulatory Visit: Payer: Self-pay

## 2024-04-02 ENCOUNTER — Ambulatory Visit
Admission: RE | Admit: 2024-04-02 | Discharge: 2024-04-02 | Disposition: A | Discharge status: Home / Self Care | Payer: Self-pay | Source: Ambulatory Visit | Attending: Family Medicine | Admitting: Family Medicine

## 2024-04-02 VITALS — BP 123/71 | HR 67 | Temp 99.0°F | Resp 18

## 2024-04-02 DIAGNOSIS — R350 Frequency of micturition: Secondary | ICD-10-CM | POA: Insufficient documentation

## 2024-04-02 LAB — POCT URINALYSIS DIP (MANUAL ENTRY)
Bilirubin, UA: NEGATIVE
Blood, UA: NEGATIVE
Glucose, UA: NEGATIVE mg/dL
Ketones, POC UA: NEGATIVE mg/dL
Nitrite, UA: NEGATIVE
Protein Ur, POC: NEGATIVE mg/dL
Spec Grav, UA: <=1.005 — AB (ref 1.010–1.025)
Urobilinogen, UA: 0.2 U/dL
pH, UA: 6 (ref 5.0–8.0)

## 2024-04-02 NOTE — Telephone Encounter (Signed)
 Copied from CRM [id number]. Topic: Clinical - Red Word Triage >> Apr 02, 2024 11:16 AM Opal Bill wrote: Red Word that prompted transfer to Nurse Triage: Burning and foaming from penis when urinating and pt feels it started when he began taking colchicine 0.6 MG tablet. Pee feels like it comes out hot. Wants to make an appointment.  Chief Complaint: Urinary symptoms Symptoms: Painful urination, lower back pain Frequency: 2-3 weeks Pertinent Negatives: Patient denies fever Disposition: [] ED /[x] Urgent Care (no appt availability in office) / [] Appointment(In office/virtual)/ []  [place] [facility]/ [] Home Care/ [] Refused Recommended Disposition /[] [place] [facility]/ []  Follow-up with PCP Additional Notes: Patient called in to report urinary symptoms. Patient stated symptoms have been ongoing for 2-3 weeks. Patient reported painful urination and lower back pain. Patient denied fever and urinary retention. Advised patient to be seen within 24 hours, per protocol. No availability with PCP/PCP office. Advised UC. Patient complied. Provided care advice and instructed patient to call back if symptoms worsen. Patient complied.   Reason for Disposition  Side (flank) or lower back pain present  Answer Assessment - Initial Assessment Questions 1. SYMPTOM: "What's the main symptom you're concerned about?" (e.g., frequency, incontinence)     Pain when urinating, foam when urinating 2. ONSET: "When did the urinary symptoms start?"     A coupled of weeks  3. PAIN: "Is there any pain?" If Yes, ask: "How bad is it?" (Scale: 1-10; mild, moderate, severe)     Sometimes 7-8 4. CAUSE: "What do you think is causing the symptoms?"     Unknown 5. OTHER SYMPTOMS: "Do you have any other symptoms?" (e.g., blood in urine, fever, flank pain, pain with urination)     Lower back pain at times, denies fever, denies blood in urine, states he is able to empty his bladder  Protocols used: Urinary Symptoms-A-AH

## 2024-04-02 NOTE — ED Provider Notes (Signed)
 [facility]  Note:  This document was prepared using Conservation officer, historic buildings and may include unintentional dictation errors.  MRN: [id number] DOB: 1981/11/25  Subjective:   Angel Robles is a 43 y.o. male presenting for 3-week history of persistent foamy urine.  Has also had occasional dysuria.  Denies hematuria, urinary frequency, penile discharge, penile swelling, testicular pain, testicular swelling, anal pain, groin pain.  Patient primarily hydrated with water.  Previously he was drinking alcohol excessively but quit drinking in December.  No current facility-administered medications for this encounter.  Current Outpatient Medications:    amLODipine (NORVASC) 5 MG tablet, Take 1 tablet (5 mg total) by mouth daily., Disp: 90 tablet, Rfl: 1   colchicine 0.6 MG tablet, Take 1 tablet (0.6 mg total) by mouth daily., Disp: 30 tablet, Rfl: 4   indomethacin (INDOCIN) 50 MG capsule, Take 1 capsule (50 mg total) by mouth 2 (two) times daily with a meal., Disp: 14 capsule, Rfl: 0   sildenafil (VIAGRA) 50 MG tablet, Take 1 tablet by mouth 30-60 minutes prior to intercourse.  Limit use to 1 tab/24 hrs. (Patient not taking: Reported on 01/24/2024), Disp: 10 tablet, Rfl: 3   Allergies  Allergen Reactions   Pork-Derived Products Rash    Past Medical History:  Diagnosis Date   Asthma    Hypertension      Past Surgical History:  Procedure Laterality Date   NO PAST SURGERIES      Family History  Problem Relation Age of Onset   Diabetes Mother     Social History   Tobacco Use   Smoking status: Former   Smokeless tobacco: Never  Vaping Use   Vaping status: Never Used  Substance Use Topics   Alcohol use: Yes    Alcohol/week: 2.0 standard drinks of alcohol    Types: 2 Cans of beer per week    Comment: occ.   Drug use: No    ROS   Objective:   Vitals: BP 123/71 (BP Location: Left Arm)   Pulse 67   Temp 99 F (37.2 C) (Oral)   Resp 18   SpO2  98%   Physical Exam Constitutional:      General: He is not in acute distress.    Appearance: Normal appearance. He is well-developed and normal weight. He is not ill-appearing, toxic-appearing or diaphoretic.  HENT:     Head: Normocephalic and atraumatic.     Right Ear: External ear normal.     Left Ear: External ear normal.     Nose: Nose normal.     Mouth/Throat:     Pharynx: Oropharynx is clear.  Eyes:     General: No scleral icterus.       Right eye: No discharge.        Left eye: No discharge.     Extraocular Movements: Extraocular movements intact.  Cardiovascular:     Rate and Rhythm: Normal rate.  Pulmonary:     Effort: Pulmonary effort is normal.  Abdominal:     General: Bowel sounds are normal. There is no distension.     Palpations: Abdomen is soft. There is no mass.     Tenderness: There is no abdominal tenderness. There is no right CVA tenderness, left CVA tenderness, guarding or rebound.  Musculoskeletal:     Cervical back: Normal range of motion.  Neurological:     Mental Status: He is alert and oriented to person, place, and time.  Psychiatric:  Mood and Affect: Mood normal.        Behavior: Behavior normal.        Thought Content: Thought content normal.        Judgment: Judgment normal.     Results for orders placed or performed during the hospital encounter of 04/02/24 (from the past 24 hours)  POCT urinalysis dipstick     Status: Abnormal   Collection Time: 04/02/24  1:39 PM  Result Value Ref Range   Color, UA yellow yellow   Clarity, UA clear clear   Glucose, UA negative negative mg/dL   Bilirubin, UA negative negative   Ketones, POC UA negative negative mg/dL   Spec Grav, UA <=1.610 (A) 1.010 - 1.025   Blood, UA negative negative   pH, UA 6.0 5.0 - 8.0   Protein Ur, POC negative negative mg/dL   Urobilinogen, UA 0.2 0.2 or 1.0 E.U./dL   Nitrite, UA Negative Negative   Leukocytes, UA Trace (A) Negative    Assessment and Plan :    PDMP not reviewed this encounter.  1. Urinary frequency    Continue hydrating really well.  Urine culture pending, will treat as appropriate based off of those results.  Counseled patient on potential for adverse effects with medications prescribed/recommended today, ER and return-to-clinic precautions discussed, patient verbalized understanding.    Adolph Hoop, [place] 04/02/24 1348

## 2024-04-02 NOTE — Telephone Encounter (Signed)
 FYI  Pt has an appointment at urgent care 04/02/24 at 1pm

## 2024-04-02 NOTE — ED Triage Notes (Signed)
 Pt c/o dysuria and foamy urine x3 weeks.No home interventions.

## 2024-04-03 ENCOUNTER — Other Ambulatory Visit: Payer: Self-pay

## 2024-04-04 LAB — URINE CULTURE: Culture: NO GROWTH

## 2024-04-23 ENCOUNTER — Other Ambulatory Visit: Payer: Self-pay

## 2024-04-28 ENCOUNTER — Encounter: Payer: Self-pay | Admitting: Internal Medicine

## 2024-04-28 ENCOUNTER — Ambulatory Visit: Payer: Self-pay | Attending: Internal Medicine | Admitting: Internal Medicine

## 2024-04-28 VITALS — BP 121/67 | HR 82 | Ht 66.0 in | Wt 137.2 lb

## 2024-04-28 DIAGNOSIS — I10 Essential (primary) hypertension: Secondary | ICD-10-CM

## 2024-04-28 DIAGNOSIS — R82998 Other abnormal findings in urine: Secondary | ICD-10-CM

## 2024-04-28 DIAGNOSIS — Z23 Encounter for immunization: Secondary | ICD-10-CM

## 2024-04-28 DIAGNOSIS — M109 Gout, unspecified: Secondary | ICD-10-CM

## 2024-04-28 MED ORDER — TETANUS-DIPHTH-ACELL PERTUSSIS 5-2-15.5 LF-MCG/0.5 IM SUSP: 0.5000 mL | Freq: Once | INTRAMUSCULAR | 0 refills | Status: AC

## 2024-04-28 NOTE — Progress Notes (Signed)
 Patient ID: Angel Robles, male    DOB: 1981-07-12  MRN: [id number]  CC: Hypertension   Subjective: Angel Robles is a 43 y.o. male who presents for chronic ds management. His concerns today include:  Patient with history of the ED, HTN, hypertriglyceridemia, ETOH use disorder.   AMN Language interpreter used during this encounter. #[id number], Angel Robles  HTN: taking Norvasc  5 mg daily as prescribed.  No device to check BP. Limits salt in foods. Occasional SOB which he associates with increase pollen.  No Le edema No flare of gout since being placed on Colchicine .  Occasional soreness RT knee  He has remained free of ETOH use since Dec 2024.  LFT done 01/2024 was nl. Seen at urgent care last month for occasional dysuria and foaminess of the urine.  Urinalysis was negative.  No protein seen in the urine.  Urine culture was negative. Reports urine still foamy at times  HM:  due for Tdapt.  Will be due for 3rd shot of hep B 07/2024  Patient Active Problem List   Diagnosis Date Noted   Gout, arthritis 01/24/2024   Mixed hyperlipidemia 12/30/2023   Abnormal LFTs 12/30/2023   Alcohol use disorder 12/30/2023   Erectile dysfunction 11/08/2023   Essential hypertension 11/08/2023     Current Outpatient Medications on File Prior to Visit  Medication Sig Dispense Refill   amLODipine  (NORVASC ) 5 MG tablet Take 1 tablet (5 mg total) by mouth daily. 90 tablet 1   colchicine  0.6 MG tablet Take 1 tablet (0.6 mg total) by mouth daily. 30 tablet 4   indomethacin  (INDOCIN ) 50 MG capsule Take 1 capsule (50 mg total) by mouth 2 (two) times daily with a meal. (Patient not taking: Reported on 04/28/2024) 14 capsule 0   sildenafil  (VIAGRA ) 50 MG tablet Take 1 tablet by mouth 30-60 minutes prior to intercourse.  Limit use to 1 tab/24 hrs. (Patient not taking: Reported on 12/30/2023) 10 tablet 3   No current facility-administered medications on file prior to visit.    Allergies  Allergen Reactions   Pork-Derived  Products Rash    Social History   Socioeconomic History   Marital status: Single    Spouse name: Not on file   Number of children: Not on file   Years of education: Not on file   Highest education level: Not on file  Occupational History   Not on file  Tobacco Use   Smoking status: Former   Smokeless tobacco: Never  Vaping Use   Vaping status: Never Used  Substance and Sexual Activity   Alcohol use: Yes    Alcohol/week: 2.0 standard drinks of alcohol    Types: 2 Cans of beer per week    Comment: occ.   Drug use: No   Sexual activity: Yes    Partners: Female  Other Topics Concern   Not on file  Social History Narrative   Not on file   Social Drivers of Health   Financial Resource Strain: High Risk (12/30/2023)   Overall Financial Resource Strain (CARDIA)    Difficulty of Paying Living Expenses: Hard  Food Insecurity: Food Insecurity Present (12/30/2023)   Hunger Vital Sign    Worried About Running Out of Food in the Last Year: Sometimes true    Ran Out of Food in the Last Year: Sometimes true  Transportation Needs: No Transportation Needs (12/30/2023)   PRAPARE - Administrator, Civil Service (Medical): No    Lack of Transportation (Non-Medical): No  Physical Activity: Insufficiently Active (12/30/2023)   Exercise Vital Sign    Days of Exercise per Week: 2 days    Minutes of Exercise per Session: 30 min  Stress: No Stress Concern Present (12/30/2023)   [employer] of Occupational Health - Occupational Stress Questionnaire    Feeling of Stress : Only a little  Social Connections: Moderately Isolated (12/30/2023)   Social Connection and Isolation Panel [NHANES]    Frequency of Communication with Friends and Family: Twice a week    Frequency of Social Gatherings with Friends and Family: Never    Attends Religious Services: More than 4 times per year    Active Member of Clubs or Organizations: No    Attends Banker Meetings: Never     Marital Status: Married  Catering manager Violence: Not At Risk (12/30/2023)   Humiliation, Afraid, Rape, and Kick questionnaire    Fear of Current or Ex-Partner: No    Emotionally Abused: No    Physically Abused: No    Sexually Abused: No    Family History  Problem Relation Age of Onset   Diabetes Mother     Past Surgical History:  Procedure Laterality Date   NO PAST SURGERIES      ROS: Review of Systems Negative except as stated above  PHYSICAL EXAM: BP 121/67 (BP Location: Left Arm, Patient Position: Sitting)   Pulse 82   Ht 5\' 6"  (1.676 m)   Wt 137 lb 3.2 oz (62.2 kg)   SpO2 99%   BMI 22.14 kg/m   Physical Exam   General appearance - alert, well appearing, and in no distress Mental status - normal mood, behavior, speech, dress, motor activity, and thought processes Neck - supple, no significant adenopathy Chest - clear to auscultation, no wheezes, rales or rhonchi, symmetric air entry Heart - normal rate, regular rhythm, normal S1, S2, no murmurs, rubs, clicks or gallops Extremities - peripheral pulses normal, no pedal edema, no clubbing or cyanosis     Latest Ref Rng & Units 01/24/2024    3:37 PM 12/30/2023   11:04 AM 11/08/2023    9:40 AM  CMP  Glucose 70 - 99 mg/dL 90   578   BUN 6 - 24 mg/dL 6   6   Creatinine 4.69 - 1.27 mg/dL 6.29   5.28   Sodium 413 - 144 mmol/L 139   136   Potassium 3.5 - 5.2 mmol/L 4.3   4.9   Chloride 96 - 106 mmol/L 103   97   CO2 20 - 29 mmol/L 22   18   Calcium 8.7 - 10.2 mg/dL 8.8   9.9   Total Protein 6.0 - 8.5 g/dL 7.1  7.5  8.6   Total Bilirubin 0.0 - 1.2 mg/dL 0.4  0.6  0.4   Alkaline Phos 44 - 121 IU/L 73  77  120   AST 0 - 40 IU/L 29  23  135   ALT 0 - 44 IU/L 21  33  80    Lipid Panel     Component Value Date/Time   CHOL 212 (H) 12/30/2023 1104   TRIG 149 12/30/2023 1104   HDL 63 12/30/2023 1104   CHOLHDL 3.4 12/30/2023 1104   LDLCALC 123 (H) 12/30/2023 1104    CBC    Component Value Date/Time   WBC 7.8  11/08/2023 0940   WBC 9.9 08/29/2014 1117   RBC 4.86 11/08/2023 0940   RBC 4.92 08/29/2014 1117   HGB 15.9  11/08/2023 0940   HCT 45.2 11/08/2023 0940   PLT 201 11/08/2023 0940   MCV 93 11/08/2023 0940   MCH 32.7 11/08/2023 0940   MCH 31.7 08/29/2014 1117   MCHC 35.2 11/08/2023 0940   MCHC 35.2 08/29/2014 1117   RDW 12.4 11/08/2023 0940    ASSESSMENT AND PLAN: 1. Essential hypertension (Primary) At goal.  Continue amlodipine  5 mg daily.  2. Gout, arthritis Doing well on once daily colchicine .  3. Need for Tdap vaccination - Tdap (ADACEL) 04-17-14.5 LF-MCG/0.5 injection; Inject 0.5 mLs into the muscle once for 1 dose.  Dispense: 0.5 mL; Refill: 0  4. Foamy urine Recent UA was negative for UTI or protein in the urine.  Patient states this happens occasionally.  I think we do not need any further workup at this time.       Patient was given the opportunity to ask questions.  Patient verbalized understanding of the plan and was able to repeat key elements of the plan.   This documentation was completed using Paediatric nurse.  Any transcriptional errors are unintentional.  No orders of the defined types were placed in this encounter.    Requested Prescriptions   Signed Prescriptions Disp Refills   Tdap (ADACEL) 04-17-14.5 LF-MCG/0.5 injection 0.5 mL 0    Sig: Inject 0.5 mLs into the muscle once for 1 dose.    Return in about 4 months (around 08/29/2024).  Concetta Dee, MD, FACP

## 2024-05-05 ENCOUNTER — Other Ambulatory Visit: Payer: Self-pay

## 2024-05-21 ENCOUNTER — Other Ambulatory Visit: Payer: Self-pay

## 2024-06-05 ENCOUNTER — Other Ambulatory Visit: Payer: Self-pay

## 2024-06-23 ENCOUNTER — Telehealth: Payer: Self-pay | Admitting: Internal Medicine

## 2024-06-23 ENCOUNTER — Other Ambulatory Visit: Payer: Self-pay | Admitting: Internal Medicine

## 2024-06-23 ENCOUNTER — Other Ambulatory Visit: Payer: Self-pay

## 2024-06-23 DIAGNOSIS — M109 Gout, unspecified: Secondary | ICD-10-CM

## 2024-06-23 NOTE — Telephone Encounter (Signed)
 Duplicate: pt came by the office requesting refill on colchicine 

## 2024-06-23 NOTE — Telephone Encounter (Signed)
 Duplicate, see refill requested dated today, with the following routing:  Delores Calton FALCON, RN routed this conversation to Vicci Barnie NOVAK, MD   06/23/24  7:31 AM Hayes Digna SQUIBB, RPH routed this conversation to [facility] Refill   06/23/24  7:31 AM [facility] - [facility] contacted Hayes Digna SQUIBB, [facility] (Pharmacy)

## 2024-06-23 NOTE — Telephone Encounter (Addendum)
 Error

## 2024-06-23 NOTE — Telephone Encounter (Signed)
 Copied from CRM [id number]. Topic: Clinical - Medication Refill >> Jun 23, 2024 10:10 AM Silvana PARAS wrote: Medication: [id number]  colchicine  0.6 MG tablet   Has the patient contacted their pharmacy? Yes (Agent: If no, request that the patient contact the pharmacy for the refill. If patient does not wish to contact the pharmacy document the reason why and proceed with request.) (Agent: If yes, when and what did the pharmacy advise?)  This is the patient's preferred pharmacy:  [facility] [address] [place] [address] Phone: [phone] Fax: [phone]  Is this the correct pharmacy for this prescription? Yes If no, delete pharmacy and type the correct one.   Has the prescription been filled recently? No  Is the patient out of the medication? Yes  Has the patient been seen for an appointment in the last year OR does the patient have an upcoming appointment? Yes  Can we respond through MyChart? No  Agent: Please be advised that Rx refills may take up to 3 business days. We ask that you follow-up with your pharmacy.

## 2024-06-24 ENCOUNTER — Other Ambulatory Visit: Payer: Self-pay

## 2024-06-24 MED ORDER — COLCHICINE 0.6 MG PO TABS
0.6000 mg | ORAL_TABLET | Freq: Every day | ORAL | 4 refills | Status: DC
  Filled 2024-06-24: qty 30, 30d supply, fill #0
  Filled 2024-07-23: qty 30, 30d supply, fill #1
  Filled 2024-08-24 (×2): qty 30, 30d supply, fill #2

## 2024-07-10 ENCOUNTER — Other Ambulatory Visit: Payer: Self-pay

## 2024-07-22 ENCOUNTER — Other Ambulatory Visit: Payer: Self-pay | Admitting: Internal Medicine

## 2024-07-22 DIAGNOSIS — M109 Gout, unspecified: Secondary | ICD-10-CM

## 2024-07-22 NOTE — Telephone Encounter (Unsigned)
 Copied from CRM #[id number]. Topic: Clinical - Medication Refill >> Jul 22, 2024 12:44 PM Tiffini S wrote: Medication: colchicine  0.6 MG tablet   Has the patient contacted their pharmacy? No (Agent: If no, request that the patient contact the pharmacy for the refill. If patient does not wish to contact the pharmacy document the reason why and proceed with request.) (Agent: If yes, when and what did the pharmacy advise?)  This is the patient's preferred pharmacy:  [facility] [address] [place] [address] Phone: [phone] Fax: [phone]  Is this the correct pharmacy for this prescription? Yes If no, delete pharmacy and type the correct one.   Has the prescription been filled recently? Yes  Is the patient out of the medication? Yes, patient have one or two tablets left   Has the patient been seen for an appointment in the last year OR does the patient have an upcoming appointment? Yes  Can we respond through MyChart? No, patient asked for phone call at [phone]   Agent: Please be advised that Rx refills may take up to 3 business days. We ask that you follow-up with your pharmacy.  Spanish IntrepreterBETHA Doffing ID: [id number]

## 2024-07-23 ENCOUNTER — Other Ambulatory Visit: Payer: Self-pay

## 2024-07-24 ENCOUNTER — Other Ambulatory Visit: Payer: Self-pay

## 2024-07-24 NOTE — Telephone Encounter (Signed)
 Rx filled and picked up 07/23/2024.

## 2024-07-24 NOTE — Telephone Encounter (Signed)
 Requested Prescriptions  Refused Prescriptions Disp Refills   colchicine  0.6 MG tablet 30 tablet 4    Sig: Take 1 tablet (0.6 mg total) by mouth daily.     Endocrinology:  Gout Agents - colchicine  Failed - 07/24/2024 11:36 AM      Failed - Cr in normal range and within 360 days    Creatinine, Ser  Date Value Ref Range Status  01/24/2024 0.59 (L) 0.76 - 1.27 mg/dL Final         Failed - CBC within normal limits and completed in the last 12 months    WBC  Date Value Ref Range Status  11/08/2023 7.8 3.4 - 10.8 x10E3/uL Final    Comment:    **Effective November 18, 2023 profile [id number] WBC will be made**   non-orderable as a stand-alone order code.   08/29/2014 9.9 4.0 - 10.5 K/uL Final   RBC  Date Value Ref Range Status  11/08/2023 4.86 4.14 - 5.80 x10E6/uL Final  08/29/2014 4.92 4.22 - 5.81 MIL/uL Final   Hemoglobin  Date Value Ref Range Status  11/08/2023 15.9 13.0 - 17.7 g/dL Final   Hematocrit  Date Value Ref Range Status  11/08/2023 45.2 37.5 - 51.0 % Final   MCHC  Date Value Ref Range Status  11/08/2023 35.2 31.5 - 35.7 g/dL Final  90/86/7984 64.7 30.0 - 36.0 g/dL Final   [facility]  Date Value Ref Range Status  11/08/2023 32.7 26.6 - 33.0 pg Final  08/29/2014 31.7 26.0 - 34.0 pg Final   MCV  Date Value Ref Range Status  11/08/2023 93 79 - 97 fL Final   No results found for: PLTCOUNTKUC, LABPLAT, POCPLA RDW  Date Value Ref Range Status  11/08/2023 12.4 11.6 - 15.4 % Final         Passed - ALT in normal range and within 360 days    ALT  Date Value Ref Range Status  01/24/2024 21 0 - 44 IU/L Final         Passed - AST in normal range and within 360 days    AST  Date Value Ref Range Status  01/24/2024 29 0 - 40 IU/L Final         Passed - Valid encounter within last 12 months    Recent Outpatient Visits           2 months ago Essential hypertension   [facility] - [facility] Dept Of [place]. [facility] Barnie NOVAK, MD    6 months ago Gout, arthritis   [facility] [place] - A Dept Of [place]. [facility] Barnie NOVAK, MD   6 months ago Essential hypertension   [facility] [place] - A Dept Of [place]. [facility] Barnie NOVAK, MD   8 months ago Establishing care with new doctor, encounter for   [facility] [place] - A Dept Of [place]. [facility] Barnie NOVAK, MD   5 years ago Erectile dysfunction, unspecified erectile dysfunction type   [facility] - A Dept Of . [facility] Alec House, MD

## 2024-08-10 ENCOUNTER — Other Ambulatory Visit: Payer: Self-pay

## 2024-08-24 ENCOUNTER — Other Ambulatory Visit: Payer: Self-pay

## 2024-08-31 ENCOUNTER — Ambulatory Visit: Payer: Self-pay | Attending: Internal Medicine | Admitting: Internal Medicine

## 2024-08-31 ENCOUNTER — Other Ambulatory Visit: Payer: Self-pay

## 2024-08-31 ENCOUNTER — Encounter: Payer: Self-pay | Admitting: Internal Medicine

## 2024-08-31 VITALS — BP 128/71 | HR 65 | Temp 98.2°F | Ht 66.0 in | Wt 152.0 lb

## 2024-08-31 DIAGNOSIS — Z23 Encounter for immunization: Secondary | ICD-10-CM

## 2024-08-31 DIAGNOSIS — I10 Essential (primary) hypertension: Secondary | ICD-10-CM

## 2024-08-31 DIAGNOSIS — Z87891 Personal history of nicotine dependence: Secondary | ICD-10-CM

## 2024-08-31 DIAGNOSIS — R0789 Other chest pain: Secondary | ICD-10-CM

## 2024-08-31 DIAGNOSIS — Z79899 Other long term (current) drug therapy: Secondary | ICD-10-CM

## 2024-08-31 MED ORDER — AMLODIPINE BESYLATE 5 MG PO TABS
5.0000 mg | ORAL_TABLET | Freq: Every day | ORAL | 1 refills | Status: DC
  Filled 2024-08-31: qty 90, 90d supply, fill #0
  Filled 2024-12-30: qty 90, 90d supply, fill #1

## 2024-08-31 MED ORDER — HEPATITIS B VAC RECOMBINANT 5 MCG/0.5ML IJ SUSY
1.0000 mL | PREFILLED_SYRINGE | Freq: Once | INTRAMUSCULAR | 0 refills | Status: AC
Start: 2024-08-31 — End: 2024-08-31

## 2024-08-31 NOTE — Progress Notes (Signed)
 Patient ID: Angel Robles, male    DOB: [birth year]  MRN: [id number]  CC: Hypertension (HTN f/u. Med refill./No questions / concerns /Flu vax administered on 08/31/2024 - C.A.)   Subjective: Angel Robles is a 43 y.o. male who presents for chronic ds management. His concerns today include:  Patient with history of the ED, HTN, hypertriglyceridemia, ETOH use disorder.   AMN Language interpreter used during this encounter. #Michelle [id number]  HTN: left on Norvasc  5 mg daily on last visit Takes it in the afternoons Limits salt in foods No SOB/LE edema. Endorses intermittent LT sided CP x 2 mths; occurs about once a wk.  Described as itching sensation. No radiation down LT arm or up neck.  Nothing in particular brings it on and has not paid attention to say if it occurs with rest or exertion. 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 30 tablet 4   indomethacin  (INDOCIN ) 50 MG capsule Take 1 capsule (50 mg total) by mouth 2 (two) times daily with a meal. (Patient not taking: Reported on 08/31/2024) 14 capsule 0   sildenafil  (VIAGRA ) 50 MG tablet Take 1 tablet by mouth 30-60 minutes prior to intercourse.  Limit use to 1 tab/24 hrs. (Patient not taking: Reported on 08/31/2024) 10 tablet 3   No current facility-administered medications on file prior to visit.    Allergies  Allergen Reactions   Pork-Derived Products Rash    Social History   Socioeconomic History   Marital status: Single    Spouse name: Not on file   Number of children:  Not on file   Years of education: Not on file   Highest education level: Not on file  Occupational History   Not on file  Tobacco Use   Smoking status: Former   Smokeless tobacco: Never  Vaping Use   Vaping status: Never Used  Substance and Sexual Activity   Alcohol use: Yes    Alcohol/week: 2.0 standard drinks of alcohol    Types: 2 Cans of beer per week    Comment: occ.   Drug use: No   Sexual activity: Yes    Partners: Female  Other Topics Concern   Not on file  Social History Narrative   Not on file   Social Drivers of Health   Financial Resource Strain: High Risk (12/30/2023)   Overall Financial Resource Strain (CARDIA)    Difficulty of Paying Living Expenses: Hard  Food Insecurity: Food Insecurity Present (12/30/2023)   Hunger Vital Sign    Worried About Running Out of Food in the Last Year: Sometimes true    Ran Out of Food in the Last Year: Sometimes true  Transportation Needs: No Transportation Needs (12/30/2023)   PRAPARE - Administrator, Civil Service (Medical): No    Lack of Transportation (Non-Medical): No  Physical Activity: Insufficiently Active (12/30/2023)   Exercise Vital Sign    Days of Exercise per Week: 2 days    Minutes of Exercise per Session: 30 min  Stress:  No Stress Concern Present (12/30/2023)   [employer] of Occupational Health - Occupational Stress Questionnaire    Feeling of Stress : Only a little  Social Connections: Moderately Isolated (12/30/2023)   Social Connection and Isolation Panel    Frequency of Communication with Friends and Family: Twice a week    Frequency of Social Gatherings with Friends and Family: Never    Attends Religious Services: More than 4 times per year    Active Member of Clubs or Organizations: No    Attends Banker Meetings: Never    Marital Status: Married  Catering manager Violence: Not At Risk (12/30/2023)   Humiliation, Afraid, Rape, and Kick questionnaire    Fear of Current or  Ex-Partner: No    Emotionally Abused: No    Physically Abused: No    Sexually Abused: No    Family History  Problem Relation Age of Onset   Diabetes Mother     Past Surgical History:  Procedure Laterality Date   NO PAST SURGERIES      ROS: Review of Systems Negative except as stated above  PHYSICAL EXAM: BP 128/71 (BP Location: Left Arm, Patient Position: Sitting, Cuff Size: Normal)   Pulse 65   Temp 98.2 F (36.8 C) (Oral)   Ht 5' 6 (1.676 m)   Wt 152 lb (68.9 kg)   SpO2 99%   BMI 24.53 kg/m   Physical Exam   General appearance - alert, well appearing, middle age Hispanic male and in no distress Mental status - normal mood, behavior, speech, dress, motor activity, and thought processes Chest - clear to auscultation, no wheezes, rales or rhonchi, symmetric air entry Heart - normal rate, regular rhythm, normal S1, S2, no murmurs, rubs, clicks or gallops Extremities - peripheral pulses normal, no pedal edema, no clubbing or cyanosis     Latest Ref Rng & Units 01/24/2024    3:37 PM 12/30/2023   11:04 AM 11/08/2023    9:40 AM  CMP  Glucose 70 - 99 mg/dL 90   895   BUN 6 - 24 mg/dL 6   6   Creatinine 9.23 - 1.27 mg/dL 9.40   9.44   Sodium 865 - 144 mmol/L 139   136   Potassium 3.5 - 5.2 mmol/L 4.3   4.9   Chloride 96 - 106 mmol/L 103   97   CO2 20 - 29 mmol/L 22   18   Calcium 8.7 - 10.2 mg/dL 8.8   9.9   Total Protein 6.0 - 8.5 g/dL 7.1  7.5  8.6   Total Bilirubin 0.0 - 1.2 mg/dL 0.4  0.6  0.4   Alkaline Phos 44 - 121 IU/L 73  77  120   AST 0 - 40 IU/L 29  23  135   ALT 0 - 44 IU/L 21  33  80    Lipid Panel     Component Value Date/Time   CHOL 212 (H) 12/30/2023 1104   TRIG 149 12/30/2023 1104   HDL 63 12/30/2023 1104   CHOLHDL 3.4 12/30/2023 1104   LDLCALC 123 (H) 12/30/2023 1104    CBC    Component Value Date/Time   WBC 7.8 11/08/2023 0940   WBC 9.9 08/29/2014 1117   RBC 4.86 11/08/2023 0940   RBC 4.92 08/29/2014 1117   HGB 15.9 11/08/2023 0940    HCT 45.2 11/08/2023 0940   PLT 201 11/08/2023 0940   MCV 93 11/08/2023 0940   MCH 32.7 11/08/2023 0940  MCH 31.7 08/29/2014 1117   MCHC 35.2 11/08/2023 0940   MCHC 35.2 08/29/2014 1117   RDW 12.4 11/08/2023 0940    ASSESSMENT AND PLAN: 1. Essential hypertension (Primary) At goal Continue Norvasc  - amLODipine  (NORVASC ) 5 MG tablet; Take 1 tablet (5 mg total) by mouth daily.  Dispense: 90 tablet; Refill: 1  2. Atypical chest pain Advise to observe for now. If CP start occurring with activity, he should get back in for further cardiac eval  3. Need for influenza vaccination - Flu vaccine trivalent PF, 6mos and older(Flulaval,Afluria,Fluarix,Fluzone)  4. Need for hepatitis B vaccination Given rxn to take to pharmacy down stairs to get 3rd and final shot - hepatitis B vacrRecombinant (RECOMBIVAX HB) 5 MCG/0.5ML SUSY injection; Inject 1 mL into the muscle once for 1 dose.  Dispense: 1 mL; Refill: 0  Patient was given the opportunity to ask questions.  Patient verbalized understanding of the plan and was able to repeat key elements of the plan.   This documentation was completed using Paediatric nurse.  Any transcriptional errors are unintentional.  Orders Placed This Encounter  Procedures   Flu vaccine trivalent PF, 6mos and older(Flulaval,Afluria,Fluarix,Fluzone)     Requested Prescriptions   Signed Prescriptions Disp Refills   amLODipine  (NORVASC ) 5 MG tablet 90 tablet 1    Sig: Take 1 tablet (5 mg total) by mouth daily.   hepatitis B vacrRecombinant (RECOMBIVAX HB) 5 MCG/0.5ML SUSY injection 1 mL 0    Sig: Inject 1 mL into the muscle once for 1 dose.    Return in about 4 months (around 12/31/2024).  Barnie Louder, MD, FACP

## 2024-09-24 ENCOUNTER — Other Ambulatory Visit: Payer: Self-pay | Admitting: Internal Medicine

## 2024-09-24 DIAGNOSIS — M109 Gout, unspecified: Secondary | ICD-10-CM

## 2024-09-24 NOTE — Telephone Encounter (Unsigned)
 Copied from CRM [id number]. Topic: Clinical - Medication Refill >> Sep 24, 2024 12:37 PM Kevelyn M wrote: Medication: colchicine  0.6 MG tablet  Has the patient contacted their pharmacy? No (Agent: If no, request that the patient contact the pharmacy for the refill. If patient does not wish to contact the pharmacy document the reason why and proceed with request.) (Agent: If yes, when and what did the pharmacy advise?)  This is the patient's preferred pharmacy:  [facility] [address] [place] [address] Phone: [phone] Fax: [phone]  Is this the correct pharmacy for this prescription? Yes If no, delete pharmacy and type the correct one.   Has the prescription been filled recently? No  Is the patient out of the medication? Yes  Has the patient been seen for an appointment in the last year OR does the patient have an upcoming appointment? Yes  Can we respond through MyChart? No  Agent: Please be advised that Rx refills may take up to 3 business days. We ask that you follow-up with your pharmacy.

## 2024-09-25 ENCOUNTER — Other Ambulatory Visit: Payer: Self-pay

## 2024-09-25 MED ORDER — COLCHICINE 0.6 MG PO TABS
0.6000 mg | ORAL_TABLET | Freq: Every day | ORAL | 4 refills | Status: DC
  Filled 2024-09-25: qty 30, 30d supply, fill #0
  Filled 2024-10-26: qty 30, 30d supply, fill #1
  Filled 2024-11-27: qty 30, 30d supply, fill #2
  Filled 2024-12-30: qty 30, 30d supply, fill #3

## 2024-09-25 NOTE — Telephone Encounter (Signed)
 Requested Prescriptions  Pending Prescriptions Disp Refills   colchicine  0.6 MG tablet 30 tablet 4    Sig: Take 1 tablet (0.6 mg total) by mouth daily.     Endocrinology:  Gout Agents - colchicine  Failed - 09/25/2024  4:47 PM      Failed - Cr in normal range and within 360 days    Creatinine, Ser  Date Value Ref Range Status  01/24/2024 0.59 (L) 0.76 - 1.27 mg/dL Final         Failed - CBC within normal limits and completed in the last 12 months    WBC  Date Value Ref Range Status  11/08/2023 7.8 3.4 - 10.8 x10E3/uL Final    Comment:    **Effective November 18, 2023 profile [id number] WBC will be made**   non-orderable as a stand-alone order code.   08/29/2014 9.9 4.0 - 10.5 K/uL Final   RBC  Date Value Ref Range Status  11/08/2023 4.86 4.14 - 5.80 x10E6/uL Final  08/29/2014 4.92 4.22 - 5.81 MIL/uL Final   Hemoglobin  Date Value Ref Range Status  11/08/2023 15.9 13.0 - 17.7 g/dL Final   Hematocrit  Date Value Ref Range Status  11/08/2023 45.2 37.5 - 51.0 % Final   MCHC  Date Value Ref Range Status  11/08/2023 35.2 31.5 - 35.7 g/dL Final  90/86/7984 64.7 30.0 - 36.0 g/dL Final   [facility]  Date Value Ref Range Status  11/08/2023 32.7 26.6 - 33.0 pg Final  08/29/2014 31.7 26.0 - 34.0 pg Final   MCV  Date Value Ref Range Status  11/08/2023 93 79 - 97 fL Final   No results found for: PLTCOUNTKUC, LABPLAT, POCPLA RDW  Date Value Ref Range Status  11/08/2023 12.4 11.6 - 15.4 % Final         Passed - ALT in normal range and within 360 days    ALT  Date Value Ref Range Status  01/24/2024 21 0 - 44 IU/L Final         Passed - AST in normal range and within 360 days    AST  Date Value Ref Range Status  01/24/2024 29 0 - 40 IU/L Final         Passed - Valid encounter within last 12 months    Recent Outpatient Visits           3 weeks ago Essential hypertension   [facility] Dept Of [place]. [facility] Barn[facility] NOVAK, MD    5 months ago Essential hypertension   [facility] [place] - A Dept Of [place]. [facility] Barnie NOVAK, MD   8 months ago Gout, arthritis   [facility] [place] - A Dept Of [place]. [facility] Barnie NOVAK, MD   9 months ago Essential hypertension   [facility] [place] - A Dept Of [place]. [facility] Barnie NOVAK, MD   10 months ago Establishing care with new doctor, encounter for   [facility] [place] - A Dept Of [place]. [facility] Barnie NOVAK, MD

## 2024-10-26 ENCOUNTER — Other Ambulatory Visit: Payer: Self-pay

## 2024-11-04 ENCOUNTER — Encounter: Payer: Self-pay | Admitting: Internal Medicine

## 2024-11-27 ENCOUNTER — Other Ambulatory Visit: Payer: Self-pay

## 2024-12-30 ENCOUNTER — Other Ambulatory Visit: Payer: Self-pay

## 2024-12-31 ENCOUNTER — Encounter: Payer: Self-pay | Admitting: Internal Medicine

## 2024-12-31 ENCOUNTER — Ambulatory Visit: Payer: Self-pay | Attending: Internal Medicine | Admitting: Internal Medicine

## 2024-12-31 ENCOUNTER — Other Ambulatory Visit: Payer: Self-pay

## 2024-12-31 VITALS — BP 143/83 | HR 66 | Temp 98.1°F | Ht 66.0 in | Wt 166.0 lb

## 2024-12-31 DIAGNOSIS — Z8739 Personal history of other diseases of the musculoskeletal system and connective tissue: Secondary | ICD-10-CM

## 2024-12-31 DIAGNOSIS — I10 Essential (primary) hypertension: Secondary | ICD-10-CM

## 2024-12-31 DIAGNOSIS — Z23 Encounter for immunization: Secondary | ICD-10-CM

## 2024-12-31 MED ORDER — COLCHICINE 0.6 MG PO TABS
0.6000 mg | ORAL_TABLET | Freq: Every day | ORAL | 1 refills | Status: AC
  Filled 2024-12-31: qty 90, 90d supply, fill #0

## 2024-12-31 MED ORDER — AMLODIPINE BESYLATE 5 MG PO TABS
5.0000 mg | ORAL_TABLET | Freq: Every day | ORAL | 2 refills | Status: AC
  Filled 2024-12-31: qty 90, 90d supply, fill #0

## 2024-12-31 NOTE — Progress Notes (Signed)
 "   Patient ID: Angel Robles, male    DOB: 1981-02-14  MRN: [id number]  CC: Hypertension (HTN f/u. /No questions / concerns/Already recevied flu vax)   Subjective: Angel Robles is a 44 y.o. male who presents for chronic ds management. His chronic medical issues include:  Patient with history of the ED, HTN, hypertriglyceridemia, ETOH use disorder, Gout.   AMN Language interpreter used during this encounter. #[id number], Rufus   Discussed the use of AI scribe software for clinical note transcription with the patient, who gave verbal consent to proceed.  History of Present Illness Angel Robles is a 44 year old male with hypertension who presents for a follow-up visit.  He has been managing his hypertension with amlodipine  5 mg daily. Recently, he missed two doses due to running out of medication but resumed taking it yesterday after obtaining a refill. He does not regularly monitor his blood pressure at home as he does not own a machine. In terms of lifestyle, he limits salt intake in his meals. No shortness of breath, CP or swelling in the legs.   He experiences occasional knee pain attributed to gout, although he has not had a significant flare-up in the past year since receiving an injection. He continues to manage his gout with colchicine  as needed.      Patient Active Problem List   Diagnosis Date Noted   Gout, arthritis 01/24/2024   Mixed hyperlipidemia 12/30/2023   Abnormal LFTs 12/30/2023   Alcohol use disorder 12/30/2023   Erectile dysfunction 11/08/2023   Essential hypertension 11/08/2023     Medications Ordered Prior to Encounter[1]  Allergies[2]  Social History   Socioeconomic History   Marital status: Single    Spouse name: Not on file   Number of children: Not on file   Years of education: Not on file   Highest education level: Not on file  Occupational History   Not on file  Tobacco Use   Smoking status: Former   Smokeless tobacco: Never  Vaping Use    Vaping status: Never Used  Substance and Sexual Activity   Alcohol use: Yes    Alcohol/week: 2.0 standard drinks of alcohol    Types: 2 Cans of beer per week    Comment: occ.   Drug use: No   Sexual activity: Yes    Partners: Female  Other Topics Concern   Not on file  Social History Narrative   Not on file   Social Drivers of Health   Tobacco Use: Medium Risk (12/31/2024)   Patient History    Smoking Tobacco Use: Former    Smokeless Tobacco Use: Never    Passive Exposure: Not on file  Financial Resource Strain: High Risk (12/30/2023)   Overall Financial Resource Strain (CARDIA)    Difficulty of Paying Living Expenses: Hard  Food Insecurity: Food Insecurity Present (12/30/2023)   Hunger Vital Sign    Worried About Running Out of Food in the Last Year: Sometimes true    Ran Out of Food in the Last Year: Sometimes true  Transportation Needs: No Transportation Needs (12/30/2023)   PRAPARE - Administrator, Civil Service (Medical): No    Lack of Transportation (Non-Medical): No  Physical Activity: Insufficiently Active (12/30/2023)   Exercise Vital Sign    Days of Exercise per Week: 2 days    Minutes of Exercise per Session: 30 min  Stress: No Stress Concern Present (12/30/2023)   [employer] of Occupational Health - Occupational Stress Questionnaire  Feeling of Stress : Only a little  Social Connections: Moderately Isolated (12/30/2023)   Social Connection and Isolation Panel    Frequency of Communication with Friends and Family: Twice a week    Frequency of Social Gatherings with Friends and Family: Never    Attends Religious Services: More than 4 times per year    Active Member of [employer] or Organizations: No    Attends Banker Meetings: Never    Marital Status: Married  Catering Manager Violence: Not At Risk (12/30/2023)   Humiliation, Afraid, Rape, and Kick questionnaire    Fear of Current or Ex-Partner: No    Emotionally Abused: No     Physically Abused: No    Sexually Abused: No  Depression (PHQ2-9): Low Risk (08/31/2024)   Depression (PHQ2-9)    PHQ-2 Score: 2  Alcohol Screen: Low Risk (12/30/2023)   Alcohol Screen    Last Alcohol Screening Score (AUDIT): 0  Housing: Low Risk (12/30/2023)   Housing Stability Vital Sign    Unable to Pay for Housing in the Last Year: No    Number of Times Moved in the Last Year: 0    Homeless in the Last Year: No  Utilities: Not At Risk (12/30/2023)   AHC Utilities    Threatened with loss of utilities: No  Health Literacy: Inadequate Health Literacy (12/30/2023)   B1300 Health Literacy    Frequency of need for help with medical instructions: Sometimes    Family History  Problem Relation Age of Onset   Diabetes Mother     Past Surgical History:  Procedure Laterality Date   NO PAST SURGERIES      ROS: Review of Systems Negative except as stated above  PHYSICAL EXAM: BP (!) 143/83   Pulse 66   Temp 98.1 F (36.7 C) (Oral)   Ht 5' 6 (1.676 m)   Wt 166 lb (75.3 kg)   SpO2 99%   BMI 26.79 kg/m   Wt Readings from Last 3 Encounters:  12/31/24 166 lb (75.3 kg)  08/31/24 152 lb (68.9 kg)  04/28/24 137 lb 3.2 oz (62.2 kg)    Physical Exam  General appearance - alert, well appearing, and in no distress Mental status - normal mood, behavior, speech, dress, motor activity, and thought processes Eyes:pink conjunctiva Neck - supple, no significant adenopathy Chest - clear to auscultation, no wheezes, rales or rhonchi, symmetric air entry Heart - normal rate, regular rhythm, normal S1, S2, no murmurs, rubs, clicks or gallops Extremities - peripheral pulses normal, no pedal edema, no clubbing or cyanosis MSK: knees - no edema/erythema, good ROM    Latest Ref Rng & Units 01/24/2024    3:37 PM 12/30/2023   11:04 AM 11/08/2023    9:40 AM  CMP  Glucose 70 - 99 mg/dL 90   895   BUN 6 - 24 mg/dL 6   6   Creatinine 9.23 - 1.27 mg/dL 9.40   9.44   Sodium 865 - 144 mmol/L 139    136   Potassium 3.5 - 5.2 mmol/L 4.3   4.9   Chloride 96 - 106 mmol/L 103   97   CO2 20 - 29 mmol/L 22   18   Calcium 8.7 - 10.2 mg/dL 8.8   9.9   Total Protein 6.0 - 8.5 g/dL 7.1  7.5  8.6   Total Bilirubin 0.0 - 1.2 mg/dL 0.4  0.6  0.4   Alkaline Phos 44 - 121 IU/L 73  77  120   AST 0 - 40 IU/L 29  23  135   ALT 0 - 44 IU/L 21  33  80    Lipid Panel     Component Value Date/Time   CHOL 212 (H) 12/30/2023 1104   TRIG 149 12/30/2023 1104   HDL 63 12/30/2023 1104   CHOLHDL 3.4 12/30/2023 1104   LDLCALC 123 (H) 12/30/2023 1104    CBC    Component Value Date/Time   WBC 7.8 11/08/2023 0940   WBC 9.9 08/29/2014 1117   RBC 4.86 11/08/2023 0940   RBC 4.92 08/29/2014 1117   HGB 15.9 11/08/2023 0940   HCT 45.2 11/08/2023 0940   PLT 201 11/08/2023 0940   MCV 93 11/08/2023 0940   MCH 32.7 11/08/2023 0940   MCH 31.7 08/29/2014 1117   MCHC 35.2 11/08/2023 0940   MCHC 35.2 08/29/2014 1117   RDW 12.4 11/08/2023 0940    ASSESSMENT AND PLAN:  Assessment and Plan Assessment & Plan Essential hypertension Not at goal. Goal is <130/80 mmHg. Out of amlodipine  for two days. - Sent refill for amlodipine  5 mg daily for 90 days with refills. - Advised purchasing a blood pressure machine if affordable and checking once a wk. - Encouraged limiting salt intake. -Blood tests for cholesterol, kidney, and liver function ordered today  Hx Gout Intermittent knee pain likely due to gout. No recent major flare-ups. - Continue colchicine . RF sent  General health maintenance Need for hep B vaccine Need for Tdapt vaccine Due for tetanus and 3rd hepatitis B vaccines.  - Recommended obtaining tetanus and hepatitis B vaccines through health department.  Patient was given the opportunity to ask questions.  Patient verbalized understanding of the plan and was able to repeat key elements of the plan.   This documentation was completed using Paediatric nurse.  Any transcriptional  errors are unintentional.  Orders Placed This Encounter  Procedures   CBC   Comprehensive metabolic panel with GFR   Lipid panel     Requested Prescriptions   Signed Prescriptions Disp Refills   amLODipine  (NORVASC ) 5 MG tablet 90 tablet 2    Sig: Take 1 tablet (5 mg total) by mouth daily.   colchicine  0.6 MG tablet 90 tablet 1    Sig: Take 1 tablet (0.6 mg total) by mouth daily.    Return in about 5 months (around 05/31/2025).  Barnie Louder, MD, FACP     [1]  Current Outpatient Medications on File Prior to Visit  Medication Sig Dispense Refill   indomethacin  (INDOCIN ) 50 MG capsule Take 1 capsule (50 mg total) by mouth 2 (two) times daily with a meal. (Patient not taking: Reported on 12/31/2024) 14 capsule 0   sildenafil  (VIAGRA ) 50 MG tablet Take 1 tablet by mouth 30-60 minutes prior to intercourse.  Limit use to 1 tab/24 hrs. (Patient not taking: Reported on 12/31/2024) 10 tablet 3   No current facility-administered medications on file prior to visit.  [2]  Allergies Allergen Reactions   Porcine (Pork) Protein-Containing Drug Products Rash   "

## 2024-12-31 NOTE — Patient Instructions (Signed)
" °  VISIT SUMMARY: During your follow-up visit, we discussed your hypertension management and gout. You have been taking amlodipine  for your blood pressure but missed two doses recently. Your blood pressure was slightly elevated today. You also mentioned occasional knee pain due to gout, but no recent flare-ups. We also reviewed your general health maintenance needs, including vaccinations and routine blood tests.  YOUR PLAN: -ESSENTIAL HYPERTENSION: Essential hypertension is high blood pressure with no identifiable cause. Your blood pressure was slightly elevated today at 132/75 mmHg. The goal is to keep it below 130/80 mmHg. We have sent a refill for your amlodipine  5 mg daily for 90 days with refills. Please consider purchasing a blood pressure machine to monitor your levels at home if it is affordable. Continue to limit your salt intake in your meals.  -GOUT: Gout is a form of arthritis characterized by severe pain, redness, and tenderness in joints. You have been experiencing occasional knee pain due to gout but have not had any major flare-ups recently. Continue taking colchicine  as needed to manage your symptoms.  -GENERAL HEALTH MAINTENANCE: You are due for tetanus and hepatitis B vaccines, and routine blood tests for cholesterol, kidney, and liver function. We recommend obtaining the vaccines through the health department and have ordered the necessary blood tests.  INSTRUCTIONS: Please follow up with the health department to obtain your tetanus and hepatitis B vaccines. Additionally, get the blood tests for cholesterol, kidney, and liver function as ordered. Continue taking your amlodipine  daily and consider purchasing a blood pressure machine to monitor your levels at home. If you experience any significant knee pain or other symptoms, continue taking colchicine  as needed and contact us  if the pain worsens.                      Contains text generated by [employer].                                  Contains text generated by [employer].   "

## 2025-01-01 ENCOUNTER — Ambulatory Visit: Payer: Self-pay | Admitting: Internal Medicine

## 2025-01-01 LAB — LIPID PANEL
Chol/HDL Ratio: 4.1 ratio (ref 0.0–5.0)
Cholesterol, Total: 243 mg/dL — ABNORMAL HIGH (ref 100–199)
HDL: 59 mg/dL
LDL Chol Calc (NIH): 162 mg/dL — ABNORMAL HIGH (ref 0–99)
Triglycerides: 125 mg/dL (ref 0–149)
VLDL Cholesterol Cal: 22 mg/dL (ref 5–40)

## 2025-01-01 LAB — CBC
Hematocrit: 48.1 % (ref 37.5–51.0)
Hemoglobin: 16.1 g/dL (ref 13.0–17.7)
MCH: 29.8 pg (ref 26.6–33.0)
MCHC: 33.5 g/dL (ref 31.5–35.7)
MCV: 89 fL (ref 79–97)
Platelets: 222 x10E3/uL (ref 150–450)
RBC: 5.4 x10E6/uL (ref 4.14–5.80)
RDW: 12 % (ref 11.6–15.4)
WBC: 8.5 x10E3/uL (ref 3.4–10.8)

## 2025-01-01 LAB — COMPREHENSIVE METABOLIC PANEL WITH GFR
ALT: 29 IU/L (ref 0–44)
AST: 25 IU/L (ref 0–40)
Albumin: 4.9 g/dL (ref 4.1–5.1)
Alkaline Phosphatase: 85 IU/L (ref 47–123)
BUN/Creatinine Ratio: 23 — ABNORMAL HIGH (ref 9–20)
BUN: 16 mg/dL (ref 6–24)
Bilirubin Total: 0.6 mg/dL (ref 0.0–1.2)
CO2: 21 mmol/L (ref 20–29)
Calcium: 10.1 mg/dL (ref 8.7–10.2)
Chloride: 103 mmol/L (ref 96–106)
Creatinine, Ser: 0.71 mg/dL — ABNORMAL LOW (ref 0.76–1.27)
Globulin, Total: 3 g/dL (ref 1.5–4.5)
Glucose: 114 mg/dL — ABNORMAL HIGH (ref 70–99)
Potassium: 4.8 mmol/L (ref 3.5–5.2)
Sodium: 140 mmol/L (ref 134–144)
Total Protein: 7.9 g/dL (ref 6.0–8.5)
eGFR: 118 mL/min/1.73

## 2025-05-31 ENCOUNTER — Ambulatory Visit: Payer: Self-pay | Admitting: Internal Medicine
